# Patient Record
Sex: Male | Born: 1935 | Race: White | Hispanic: No | Smoking: Former smoker
Health system: Southern US, Community
[De-identification: ages and names within clinical notes are randomized; demographics above are authoritative.]

## PROBLEM LIST (undated history)

## (undated) DIAGNOSIS — D649 Anemia, unspecified: Secondary | ICD-10-CM

## (undated) DIAGNOSIS — I499 Cardiac arrhythmia, unspecified: Secondary | ICD-10-CM

## (undated) DIAGNOSIS — I780 Hereditary hemorrhagic telangiectasia: Secondary | ICD-10-CM

## (undated) DIAGNOSIS — I10 Essential (primary) hypertension: Secondary | ICD-10-CM

## (undated) DIAGNOSIS — C61 Malignant neoplasm of prostate: Secondary | ICD-10-CM

## (undated) DIAGNOSIS — M1990 Unspecified osteoarthritis, unspecified site: Secondary | ICD-10-CM

## (undated) DIAGNOSIS — E119 Type 2 diabetes mellitus without complications: Secondary | ICD-10-CM

## (undated) HISTORY — PX: CATARACT EXTRACTION W/ INTRAOCULAR LENS  IMPLANT, BILATERAL: SHX1307

## (undated) HISTORY — DX: Malignant neoplasm of prostate: C61

## (undated) HISTORY — PX: COLONOSCOPY: SHX174

## (undated) HISTORY — DX: Essential (primary) hypertension: I10

## (undated) HISTORY — DX: Type 2 diabetes mellitus without complications: E11.9

---

## 2000-02-10 ENCOUNTER — Encounter: Admission: RE | Admit: 2000-02-10 | Discharge: 2000-02-23 | Payer: Self-pay | Admitting: Family Medicine

## 2002-08-08 ENCOUNTER — Encounter: Admission: RE | Admit: 2002-08-08 | Discharge: 2002-08-08 | Payer: Self-pay | Admitting: Urology

## 2002-08-08 ENCOUNTER — Encounter: Payer: Self-pay | Admitting: Urology

## 2003-11-14 ENCOUNTER — Encounter: Payer: Self-pay | Admitting: Gastroenterology

## 2003-11-14 ENCOUNTER — Encounter (INDEPENDENT_AMBULATORY_CARE_PROVIDER_SITE_OTHER): Payer: Self-pay | Admitting: Specialist

## 2003-11-14 ENCOUNTER — Ambulatory Visit (HOSPITAL_COMMUNITY): Admission: RE | Admit: 2003-11-14 | Discharge: 2003-11-14 | Payer: Self-pay | Admitting: Gastroenterology

## 2003-11-14 DIAGNOSIS — K5521 Angiodysplasia of colon with hemorrhage: Secondary | ICD-10-CM | POA: Insufficient documentation

## 2003-11-14 DIAGNOSIS — D126 Benign neoplasm of colon, unspecified: Secondary | ICD-10-CM | POA: Insufficient documentation

## 2003-11-14 DIAGNOSIS — K5730 Diverticulosis of large intestine without perforation or abscess without bleeding: Secondary | ICD-10-CM | POA: Insufficient documentation

## 2006-12-20 ENCOUNTER — Ambulatory Visit: Payer: Self-pay | Admitting: Gastroenterology

## 2006-12-20 LAB — CONVERTED CEMR LAB
Basophils Relative: 0.1 % (ref 0.0–1.0)
Eosinophils Absolute: 0.2 10*3/uL (ref 0.0–0.6)
Eosinophils Relative: 2.3 % (ref 0.0–5.0)
HCT: 37.7 % — ABNORMAL LOW (ref 39.0–52.0)
Lymphocytes Relative: 15.2 % (ref 12.0–46.0)
Neutro Abs: 6.3 10*3/uL (ref 1.4–7.7)
Neutrophils Relative %: 72.7 % (ref 43.0–77.0)
Platelets: 289 10*3/uL (ref 150–400)
RDW: 15.8 % — ABNORMAL HIGH (ref 11.5–14.6)
Transferrin: 277.1 mg/dL (ref >=212.0)

## 2006-12-29 ENCOUNTER — Ambulatory Visit (HOSPITAL_COMMUNITY): Admission: RE | Admit: 2006-12-29 | Discharge: 2006-12-29 | Payer: Self-pay | Admitting: Urology

## 2008-05-09 ENCOUNTER — Ambulatory Visit (HOSPITAL_COMMUNITY): Admission: RE | Admit: 2008-05-09 | Discharge: 2008-05-09 | Payer: Self-pay | Admitting: Urology

## 2009-02-27 ENCOUNTER — Ambulatory Visit (HOSPITAL_COMMUNITY): Admission: RE | Admit: 2009-02-27 | Discharge: 2009-02-27 | Payer: Self-pay | Admitting: Urology

## 2009-07-27 ENCOUNTER — Emergency Department (HOSPITAL_COMMUNITY): Admission: EM | Admit: 2009-07-27 | Discharge: 2009-07-27 | Payer: Self-pay | Admitting: Emergency Medicine

## 2009-08-20 ENCOUNTER — Ambulatory Visit (HOSPITAL_COMMUNITY): Admission: RE | Admit: 2009-08-20 | Discharge: 2009-08-20 | Payer: Self-pay | Admitting: Urology

## 2010-02-28 ENCOUNTER — Encounter (HOSPITAL_COMMUNITY): Admission: RE | Admit: 2010-02-28 | Discharge: 2010-04-17 | Payer: Self-pay | Admitting: Urology

## 2010-08-10 ENCOUNTER — Encounter: Payer: BLUE CROSS/BLUE SHIELD | Admitting: Urology

## 2010-10-05 LAB — BASIC METABOLIC PANEL
BUN: 21 mg/dL (ref 6–23)
Calcium: 9.1 mg/dL (ref 8.4–10.5)
GFR calc non Af Amer: >60 mL/min (ref >60)
Glucose, Bld: 187 mg/dL — ABNORMAL HIGH (ref 70–99)

## 2010-10-05 LAB — CBC
HCT: 36.1 % — ABNORMAL LOW (ref 39.0–52.0)
Hemoglobin: 11.8 g/dL — ABNORMAL LOW (ref 13.0–17.0)
MCHC: 32.7 g/dL (ref 30.0–36.0)
RBC: 4 MIL/uL — ABNORMAL LOW (ref 4.22–5.81)
RDW: 14.8 % (ref 11.5–15.5)
WBC: 13.3 10*3/uL — ABNORMAL HIGH (ref 4.0–10.5)

## 2010-10-05 LAB — DIFFERENTIAL
Basophils Absolute: 0 10*3/uL (ref 0.0–0.1)
Lymphocytes Relative: 8 % — ABNORMAL LOW (ref 12–46)
Monocytes Relative: 10 % (ref 3–12)
Neutro Abs: 11 10*3/uL — ABNORMAL HIGH (ref 1.7–7.7)
Neutrophils Relative %: 82 % — ABNORMAL HIGH (ref 43–77)

## 2010-10-05 LAB — CK TOTAL AND CKMB (NOT AT ARMC)
CK, MB: 8.1 ng/mL (ref 0.3–4.0)
Total CK: 370 U/L — ABNORMAL HIGH (ref 7–232)

## 2010-12-02 NOTE — Assessment & Plan Note (Signed)
[facility]                         GASTROENTEROLOGY OFFICE NOTE   NAME:Witherington, Shyloh                           MRN:          [id number]  DATE:12/20/2006                            DOB:          Jan 06, 1916    HISTORY:  Mr. 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  He has had some  small colon polyps removed and does have diverticulosis.  Last  colonoscopy was in April of 2005.   He currently is without any gastrointestinal complaints.  He is having  regular bowel movements without melena or hematochezia.  He has no  anorexia, weight loss, upper gastrointestinal or hepatobiliary  complaints.  He is followed by Dr. Adrian Prince and is on Ferrex 150 mg  twice a day, Ramipril 2.5 mg a day and daily Centrum Silver.  He  otherwise is doing well and denies any problems on his review of systems  exam.   PHYSICAL EXAMINATION:  GENERAL APPEARANCE:  He is a healthy-appearing  white male appearing much younger than his stated age.  VITAL SIGNS:  He is 5 feet 11 inches and weighs 182 pounds.  Blood  pressure 134/70 and pulse was 80 and regular.  ABDOMEN:  His abdominal exam was entirely benign without  hepatosplenomegaly, abdominal masses or tenderness.  Bowel sounds were  normal.  Rectal exam was deferred.   ASSESSMENT:  Mr. Fraizer has had chronic gastrointestinal blood loss now  for the last 20 years with several negative colonoscopy exam's.  I see  no need to repeat his exam at this time unless he cannot keep up with  his iron loss with p.o. iron replacement therapy.   RECOMMENDATIONS:  I have sent Ed by the laboratory to check a CBC and  iron studies.  He is to continue his  medications as listed above with  p.r.n. gastrointestinal followup as needed.     Vania Rea. Jarold Motto, MD, Caleen Essex, FAGA  Electronically Signed    DRP/MedQ  DD: 12/20/2006  DT: 12/20/2006  Job #: [id number]   cc:   Tera Mater. Evlyn Kanner, M.D.

## 2011-02-04 ENCOUNTER — Other Ambulatory Visit: Payer: Self-pay | Admitting: Urology

## 2011-02-04 DIAGNOSIS — C61 Malignant neoplasm of prostate: Secondary | ICD-10-CM

## 2011-03-11 ENCOUNTER — Encounter (HOSPITAL_COMMUNITY)
Admission: RE | Admit: 2011-03-11 | Discharge: 2011-03-11 | Disposition: A | Discharge status: Home / Self Care | Payer: Medicare Other | Source: Ambulatory Visit | Attending: Urology | Admitting: Urology

## 2011-03-11 ENCOUNTER — Ambulatory Visit (HOSPITAL_COMMUNITY)
Admission: RE | Admit: 2011-03-11 | Discharge: 2011-03-11 | Disposition: A | Discharge status: Home / Self Care | Payer: Medicare Other | Source: Ambulatory Visit | Attending: Urology | Admitting: Urology

## 2011-03-11 DIAGNOSIS — C61 Malignant neoplasm of prostate: Secondary | ICD-10-CM

## 2011-03-11 MED ORDER — TECHNETIUM TC 99M MEDRONATE IV KIT
25.0000 | PACK | Freq: Once | INTRAVENOUS | Status: AC | PRN
  Administered 2011-03-11: 25 via INTRAVENOUS

## 2011-09-03 ENCOUNTER — Other Ambulatory Visit: Payer: Self-pay | Admitting: Urology

## 2011-09-03 DIAGNOSIS — C61 Malignant neoplasm of prostate: Secondary | ICD-10-CM

## 2011-09-09 ENCOUNTER — Encounter (HOSPITAL_COMMUNITY)
Admission: RE | Admit: 2011-09-09 | Discharge: 2011-09-09 | Disposition: A | Discharge status: Home / Self Care | Payer: Medicare Other | Source: Ambulatory Visit | Attending: Urology | Admitting: Urology

## 2011-09-09 DIAGNOSIS — C61 Malignant neoplasm of prostate: Secondary | ICD-10-CM | POA: Insufficient documentation

## 2011-09-09 MED ORDER — TECHNETIUM TC 99M MEDRONATE IV KIT
25.0000 | PACK | Freq: Once | INTRAVENOUS | Status: AC | PRN
  Administered 2011-09-09: 25 via INTRAVENOUS

## 2012-04-06 ENCOUNTER — Other Ambulatory Visit: Payer: Self-pay | Admitting: Urology

## 2012-04-06 DIAGNOSIS — C61 Malignant neoplasm of prostate: Secondary | ICD-10-CM

## 2012-04-14 ENCOUNTER — Ambulatory Visit (HOSPITAL_COMMUNITY)
Admission: RE | Admit: 2012-04-14 | Discharge: 2012-04-14 | Disposition: A | Discharge status: Home / Self Care | Payer: Medicare Other | Source: Ambulatory Visit | Attending: Urology | Admitting: Urology

## 2012-04-14 ENCOUNTER — Encounter (HOSPITAL_COMMUNITY)
Admission: RE | Admit: 2012-04-14 | Discharge: 2012-04-14 | Disposition: A | Discharge status: Home / Self Care | Payer: Medicare Other | Source: Ambulatory Visit | Attending: Urology | Admitting: Urology

## 2012-04-14 DIAGNOSIS — C61 Malignant neoplasm of prostate: Secondary | ICD-10-CM | POA: Insufficient documentation

## 2012-04-14 DIAGNOSIS — R972 Elevated prostate specific antigen [PSA]: Secondary | ICD-10-CM | POA: Insufficient documentation

## 2012-04-14 MED ORDER — TECHNETIUM TC 99M MEDRONATE IV KIT
24.0000 | PACK | Freq: Once | INTRAVENOUS | Status: AC | PRN
  Administered 2012-04-14: 24 via INTRAVENOUS

## 2012-06-15 ENCOUNTER — Other Ambulatory Visit: Payer: Self-pay | Admitting: Urology

## 2012-06-15 ENCOUNTER — Ambulatory Visit (HOSPITAL_COMMUNITY)
Admission: RE | Admit: 2012-06-15 | Discharge: 2012-06-15 | Disposition: A | Discharge status: Home / Self Care | Payer: Medicare Other | Source: Ambulatory Visit | Attending: Urology | Admitting: Urology

## 2012-06-15 DIAGNOSIS — J984 Other disorders of lung: Secondary | ICD-10-CM

## 2012-06-15 DIAGNOSIS — Z8546 Personal history of malignant neoplasm of prostate: Secondary | ICD-10-CM | POA: Insufficient documentation

## 2013-05-01 ENCOUNTER — Other Ambulatory Visit (HOSPITAL_COMMUNITY): Payer: Self-pay | Admitting: Endocrinology

## 2013-05-01 DIAGNOSIS — J90 Pleural effusion, not elsewhere classified: Secondary | ICD-10-CM

## 2013-05-02 ENCOUNTER — Ambulatory Visit (HOSPITAL_COMMUNITY)
Admission: RE | Admit: 2013-05-02 | Discharge: 2013-05-02 | Disposition: A | Discharge status: Home / Self Care | Payer: Medicare Other | Source: Ambulatory Visit | Attending: Endocrinology | Admitting: Endocrinology

## 2013-05-02 ENCOUNTER — Ambulatory Visit (HOSPITAL_COMMUNITY)
Admission: RE | Admit: 2013-05-02 | Discharge: 2013-05-02 | Disposition: A | Discharge status: Home / Self Care | Payer: Medicare Other | Source: Ambulatory Visit | Attending: Radiology | Admitting: Radiology

## 2013-05-02 DIAGNOSIS — J90 Pleural effusion, not elsewhere classified: Secondary | ICD-10-CM

## 2013-05-02 DIAGNOSIS — R0602 Shortness of breath: Secondary | ICD-10-CM | POA: Insufficient documentation

## 2013-05-02 LAB — BODY FLUID CELL COUNT WITH DIFFERENTIAL
Eos, Fluid: 2 %
Monocyte-Macrophage-Serous Fluid: 35 % — ABNORMAL LOW (ref 50–90)
Neutrophil Count, Fluid: 10 % (ref 0–25)
Total Nucleated Cell Count, Fluid: 280 cu mm (ref 0–1000)

## 2013-05-02 LAB — GLUCOSE, SEROUS FLUID: Glucose, Fluid: 208 mg/dL

## 2013-05-02 LAB — PROTEIN, BODY FLUID: Total protein, fluid: 2.1 g/dL

## 2013-05-02 LAB — LACTATE DEHYDROGENASE, PLEURAL OR PERITONEAL FLUID

## 2013-05-02 NOTE — Procedures (Signed)
Successful US guided left thoracentesis. Yielded 1.4L of clear yellow fluid. Pt tolerated procedure well. No immediate complications.  Specimen was sent for labs. CXR ordered.  Brayton El PA-C 05/02/2013 11:40 AM

## 2013-05-05 LAB — BODY FLUID CULTURE: Culture: NO GROWTH

## 2013-05-09 ENCOUNTER — Ambulatory Visit
Admission: RE | Admit: 2013-05-09 | Discharge: 2013-05-09 | Disposition: A | Discharge status: Home / Self Care | Payer: Medicare Other | Source: Ambulatory Visit | Attending: Endocrinology | Admitting: Endocrinology

## 2013-05-09 ENCOUNTER — Other Ambulatory Visit: Payer: Self-pay | Admitting: Endocrinology

## 2013-05-09 ENCOUNTER — Telehealth: Payer: Self-pay

## 2013-05-09 DIAGNOSIS — J90 Pleural effusion, not elsewhere classified: Secondary | ICD-10-CM

## 2013-05-09 MED ORDER — IOHEXOL 300 MG/ML  SOLN
75.0000 mL | Freq: Once | INTRAMUSCULAR | Status: AC | PRN
  Administered 2013-05-09: 75 mL via INTRAVENOUS

## 2013-05-09 NOTE — Telephone Encounter (Signed)
[email or web address] x1 on named VM for [employer]

## 2013-05-10 NOTE — Telephone Encounter (Signed)
Spoke with Malachi Bonds and given appt time with Dr Maple Hudson 05/11/13 at 9:45

## 2013-05-11 ENCOUNTER — Encounter: Payer: Self-pay | Admitting: Internal Medicine

## 2013-05-11 ENCOUNTER — Ambulatory Visit (INDEPENDENT_AMBULATORY_CARE_PROVIDER_SITE_OTHER): Payer: Medicare Other | Admitting: Internal Medicine

## 2013-05-11 VITALS — BP 108/60 | HR 83 | Ht 70.0 in | Wt 159.0 lb

## 2013-05-11 DIAGNOSIS — C61 Malignant neoplasm of prostate: Secondary | ICD-10-CM

## 2013-05-11 DIAGNOSIS — J90 Pleural effusion, not elsewhere classified: Secondary | ICD-10-CM

## 2013-05-11 DIAGNOSIS — Z8546 Personal history of malignant neoplasm of prostate: Secondary | ICD-10-CM

## 2013-05-11 NOTE — Progress Notes (Addendum)
05/11/13-  79 yo M referred courtesy of Jason Lawson- PE; fluid coming back in lungs Daughter Jason Lawson is here Patient smoked one half pack per day for 30 years, quitting 50 years ago.Denies any prior diagnosis of lung disease, including pneumonia or TB. Long history of prostate cancer, treated with radiation therapy 25 years ago and currently on Lupron injection. Gradually developed dyspnea on exertion and dry cough over the past month. Discovered moderate left pleural effusion with thoracentesis on 05/02/2013 for1.4 L of clear yellow fluid. Fluid protein 2.1g, culture negative, fluid LDH 184, fluid glucose 208. Fluid cytology showed reactive mesothelial cells. He felt significantly better after thoracentesis with improved dyspnea and relief of cough. Cough has not returned. Dyspnea remains better than before but not baseline. He was walking every other day for one half hour until months ago and has not resumed. Denies chest pain. CT chest shows apparent reaccumulation of fluid. Several small right upper lobe nodules and ground-glass opacity in the right upper lobe report by radiology to be infectious or inflammatory. CT 05/09/13- IMPRESSION:  1. Moderate mobile left pleural effusion with associated mild  atelectasis.  2. Branching nodules in the right upper lobe and ground-glass  opacity in the right upper lobe are likely infectious or  inflammatory.  3. A 5 mm nodule at the right lung base. Given risk factors for  bronchogenic carcinoma, follow-up chest CT at 6 - 12 months is  recommended. This recommendation follows the consensus statement:  Guidelines for Management of Small Pulmonary Nodules Detected on CT  Scans: A Statement from the Fleischner Society as published in  Radiology 2005;237:395-400.  Electronically Signed  By: Jason Lawson M.D.  On: 05/09/2013 16:44  Prior to Admission medications   Medication Sig Start Date End Date Taking? Authorizing Provider  b complex  vitamins tablet Take 1 tablet by mouth daily.   Yes Historical Provider, MD  Calcium Carbonate (CALTRATE 600 PO) Take 1 capsule by mouth 2 (two) times daily.   Yes Historical Provider, MD  furosemide (LASIX) 40 MG tablet Take 1 tablet by mouth daily. 05/01/13  Yes Historical Provider, MD  iron polysaccharides (NIFEREX) 150 MG capsule Take 150 mg by mouth 2 (two) times daily.   Yes Historical Provider, MD  metFORMIN (GLUCOPHAGE) 500 MG tablet Take 1 tablet by mouth 2 (two) times daily. 04/03/13  Yes Historical Provider, MD  Multiple Vitamins-Minerals (CENTRUM SILVER PO) Take 1 tablet by mouth daily.   Yes Historical Provider, MD  ramipril (ALTACE) 2.5 MG capsule Take 1 capsule by mouth daily. 05/07/13  Yes Historical Provider, MD   Past Medical History  Diagnosis Date  . Diabetes   . Prostate cancer    History reviewed. No pertinent past surgical history. Family History  Problem Relation Age of Onset  . Allergies Daughter   . Heart disease Daughter   . Breast cancer Mother    History   Social History  . Marital Status: Widowed    Spouse Name: N/A    Number of Children: 1  . Years of Education: N/A   Occupational History  . retired-[employer]    Social History Main Topics  . Smoking status: Former Smoker -- 0.50 packs/day for 30 years    Types: Cigarettes    Quit date: 05/12/1963  . Smokeless tobacco: Not on file  . Alcohol Use: Yes     Comment: very rare use  . Drug Use: No  . Sexual Activity: Not on file  Other Topics Concern  . Not on file   Social History Narrative  . No narrative on file   ROS-see HPI Constitutional:   No-   weight loss, night sweats, fevers, chills, fatigue, lassitude. HEENT:   No-  headaches, difficulty swallowing, tooth/dental problems, sore throat,       No-  sneezing, itching, ear ache, nasal congestion, post nasal drip,  CV:  No-   chest pain, orthopnea, PND, swelling in lower extremities, anasarca, dizziness,  palpitations Resp: +shortness of breath with exertion or at rest.              No-   productive cough,  No non-productive cough,  No- coughing up of blood.              No-   change in color of mucus.  No- wheezing.   Skin: No-   rash or lesions. GI:  No-   heartburn, indigestion, abdominal pain, nausea, vomiting, diarrhea,                 change in bowel habits, loss of appetite GU: No-   dysuria, change in color of urine, no urgency or frequency.  No- flank pain. MS:  No-   joint pain or swelling.  No- decreased range of motion.  No- back pain. Neuro-     nothing unusual Psych:  No- change in mood or affect. No depression or anxiety.  No memory loss.  OBJ- Physical Exam General- Alert, Oriented, Affect-appropriate, Distress- none acute. Trim, alert man looking younger than stated age and appropriately interactive. Skin- rash-none, lesions- none, excoriation- none Lymphadenopathy- none Head- atraumatic            Eyes- Gross vision intact, PERRLA, conjunctivae and secretions clear            Ears- Hearing, canals-normal            Nose- Clear, no-Septal dev, mucus, polyps, erosion, perforation             Throat- Mallampati II , mucosa clear , drainage- none, tonsils- atrophic Neck- flexible , trachea midline, no stridor , thyroid nl, carotid no bruit Chest - symmetrical excursion , unlabored           Heart/CV- RRR , no murmur , no gallop  , no rub, nl s1 s2                           - JVD- none , edema- none, stasis changes- none, varices- none           Lung- + Dullness to percussion and absence of breath sounds lower half of left chest, rub- none           Chest wall-  Abd- tender-no, distended-no, bowel sounds-present, HSM- no Br/ Gen/ Rectal- Not done, not indicated Extrem- cyanosis- none, clubbing, none, atrophy- none, strength- nl Neuro- grossly intact to observation

## 2013-05-11 NOTE — Assessment & Plan Note (Signed)
Most likely this effusion is related to his prostate cancer. Exudative by chemistry and apparently reaccumulating after thoracentesis on October 14, I think he will be better served by surgical evaluation for drainage options rather than repeated thoracentesis. Less likely his effusion is related to his prostate cancer, although the branching nodules in the right upper lobe are favored to be infectious or inflammatory. Fluid cultures so far are negative and he is not running fever or experienced obvious symptoms of infection. Although 79 years old, he looks appropriate for consideration of more than passive observation, and capable of participating in the decision.

## 2013-05-11 NOTE — Patient Instructions (Signed)
Order- referral to Thoracic Surgery for dx recurrent exudative pleural effusion, hx prostate CA

## 2013-05-12 ENCOUNTER — Other Ambulatory Visit: Payer: Self-pay | Admitting: *Deleted

## 2013-05-12 ENCOUNTER — Institutional Professional Consult (permissible substitution) (INDEPENDENT_AMBULATORY_CARE_PROVIDER_SITE_OTHER): Payer: Medicare Other | Admitting: Cardiothoracic Surgery

## 2013-05-12 VITALS — BP 116/72 | HR 74 | Resp 18 | Ht 67.0 in | Wt 167.0 lb

## 2013-05-12 DIAGNOSIS — J90 Pleural effusion, not elsewhere classified: Secondary | ICD-10-CM

## 2013-05-12 NOTE — Progress Notes (Signed)
PCP is Julian Hy, MD Referring Provider is Waymon Budge, MD  Chief Complaint  Patient presents with  . Pleural Effusion    left...has had thoracentesis x 1.Marland KitchenMarland KitchenCT CHEST 05/09/13   recurrent left pleural effusion status post thoracentesis  HPI: 79 year old Caucasian male nonsmoker presents for evaluation of recurrent left pleural effusion. Early this month he had shortness of breath and cough and was found have a moderate left pleural effusion. He underwent ultrasound-guided left thoracentesis removing 1.2 L of an exudative fluid. Cultures were negative. Cytology was negative. Patient has history of atrial fibrillation hypertension and probably has diastolic heart failure. He was started on oral Lasix recently. He denies any other symptoms of heart failure however including needle edema or orthopnea. He has been having decreased exercise tolerance associated with the left pleural effusion. He presents for surgical options including left Pleurx catheter. He has never had previous thoracic surgery. He denies history of MI. She's not on anticoagulation for his atrial fibrillation because of history of GI bleeding. He lives with his daughter Darel Hong.  Past Medical History  Diagnosis Date  . Diabetes   . Prostate cancer     No past surgical history on file.  Family History  Problem Relation Age of Onset  . Allergies Daughter   . Heart disease Daughter   . Breast cancer Mother     Social History History  Substance Use Topics  . Smoking status: Former Smoker -- 0.50 packs/day for 30 years    Types: Cigarettes    Quit date: 05/12/1963  . Smokeless tobacco: Not on file  . Alcohol Use: Yes     Comment: very rare use    Current Outpatient Prescriptions  Medication Sig Dispense Refill  . b complex vitamins tablet Take 1 tablet by mouth daily.      . Calcium Carbonate (CALTRATE 600 PO) Take 1 capsule by mouth 2 (two) times daily.      . furosemide (LASIX) 40 MG tablet Take 1  tablet by mouth daily.      . iron polysaccharides (NIFEREX) 150 MG capsule Take 150 mg by mouth 2 (two) times daily.      Marland Kitchen leuprolide (LUPRON) 11.25 MG injection Inject 11.25 mg into the muscle every 3 (three) months. AS DIRECTED      . metFORMIN (GLUCOPHAGE) 500 MG tablet Take 1 tablet by mouth 2 (two) times daily.      . Multiple Vitamins-Minerals (CENTRUM SILVER PO) Take 1 tablet by mouth daily.      . ramipril (ALTACE) 2.5 MG capsule Take 1 capsule by mouth daily.       No current facility-administered medications for this visit.    No Known Allergies  Review of Systems Remote history of prostate cancer treated with external beam radiation recurrent now being treated with hormonal therapy. He states he has no problems passing his urine however. No recent weight loss or fever. No recent upper respiratory infection. Patient is right-hand dominant. He denies stroke or neuropathy. No history of bleeding problems other than GI bleeding.  BP 116/72  Pulse 74  Resp 18  Ht 5\' 7"  (1.702 m)  Wt 167 lb (75.751 kg)  BMI 26.15 kg/m2  SpO2 98% Physical Exam Alert and comfortable accompanied by his daughter HEENT normocephalic good dentition Neck without JVD mass or adenopathy Breath sounds are diminished at the left base clear on the right Cardiac rhythm is regular but without S3 gallop or murmur Abdomen soft nontender Extremities without tenderness edema or  cyanosis Neurologic is nonfocal  Diagnostic Tests: Chest x-ray, CT scan demonstrates recurrent pleural effusion, nonloculated. No evidence of adenopathy or suggestion of malignancy. No evidence of airspace disease or pneumonia.  Impression: Left Pleurx catheter placement was recommended to the patient and his daughter. I discussed the procedure and postoperative care in detail. They wish to proceed with a Pleurx catheter to manage recurrent pleural effusion, to avoid future repeated thoracentesis, and to provide a chance for  resolution of the effusion and subsequent removal of the catheter.  Plan: Left Pleurx catheter scheduled for October 29 at . Patient will be kept overnight for observation due to his advanced age history of atrial fibrillation and diabetes.

## 2013-05-12 NOTE — Patient Instructions (Signed)
No breakfast on day of surgery Take blood pressure pill-ramapril with sip of water

## 2013-05-15 ENCOUNTER — Encounter (HOSPITAL_COMMUNITY): Payer: Self-pay | Admitting: Pharmacy Technician

## 2013-05-16 ENCOUNTER — Encounter (HOSPITAL_COMMUNITY): Payer: Self-pay

## 2013-05-16 ENCOUNTER — Other Ambulatory Visit: Payer: Self-pay | Admitting: *Deleted

## 2013-05-16 ENCOUNTER — Encounter (HOSPITAL_COMMUNITY)
Admission: RE | Admit: 2013-05-16 | Discharge: 2013-05-16 | Disposition: A | Discharge status: Home / Self Care | Payer: Medicare Other | Source: Ambulatory Visit | Attending: Cardiothoracic Surgery | Admitting: Cardiothoracic Surgery

## 2013-05-16 ENCOUNTER — Encounter (HOSPITAL_COMMUNITY): Payer: Self-pay | Admitting: Vascular Surgery

## 2013-05-16 VITALS — BP 106/68 | HR 85 | Temp 97.5°F | Resp 18 | Ht 71.0 in | Wt 158.4 lb

## 2013-05-16 DIAGNOSIS — Z01818 Encounter for other preprocedural examination: Secondary | ICD-10-CM | POA: Insufficient documentation

## 2013-05-16 DIAGNOSIS — J90 Pleural effusion, not elsewhere classified: Secondary | ICD-10-CM

## 2013-05-16 DIAGNOSIS — Z01812 Encounter for preprocedural laboratory examination: Secondary | ICD-10-CM | POA: Insufficient documentation

## 2013-05-16 DIAGNOSIS — Z01810 Encounter for preprocedural cardiovascular examination: Secondary | ICD-10-CM | POA: Insufficient documentation

## 2013-05-16 HISTORY — DX: Cardiac arrhythmia, unspecified: I49.9

## 2013-05-16 HISTORY — DX: Anemia, unspecified: D64.9

## 2013-05-16 HISTORY — DX: Unspecified osteoarthritis, unspecified site: M19.90

## 2013-05-16 HISTORY — DX: Hereditary hemorrhagic telangiectasia: I78.0

## 2013-05-16 LAB — COMPREHENSIVE METABOLIC PANEL
ALT: 12 U/L (ref 0–53)
AST: 23 U/L (ref 0–37)
Albumin: 3.4 g/dL — ABNORMAL LOW (ref 3.5–5.2)
Alkaline Phosphatase: 65 U/L (ref 39–117)
BUN: 21 mg/dL (ref 6–23)
CO2: 30 mEq/L (ref 19–32)
Calcium: 9.5 mg/dL (ref 8.4–10.5)
Chloride: 96 mEq/L (ref 96–112)
Creatinine, Ser: 0.92 mg/dL (ref 0.50–1.35)
GFR calc Af Amer: 81 mL/min — ABNORMAL LOW (ref >90)
GFR calc non Af Amer: 69 mL/min — ABNORMAL LOW (ref >90)
Glucose, Bld: 187 mg/dL — ABNORMAL HIGH (ref 70–99)
Potassium: 4.3 mEq/L (ref 3.5–5.1)
Sodium: 135 mEq/L (ref 135–145)
Total Bilirubin: 0.3 mg/dL (ref 0.3–1.2)
Total Protein: 7.5 g/dL (ref 6.0–8.3)

## 2013-05-16 LAB — CBC
HCT: 37.6 % — ABNORMAL LOW (ref 39.0–52.0)
Hemoglobin: 12 g/dL — ABNORMAL LOW (ref 13.0–17.0)
MCH: 27 pg (ref 26.0–34.0)
MCHC: 31.9 g/dL (ref 30.0–36.0)
MCV: 84.7 fL (ref 78.0–100.0)
Platelets: 353 10*3/uL (ref 150–400)
RBC: 4.44 MIL/uL (ref 4.22–5.81)
RDW: 16.3 % — ABNORMAL HIGH (ref 11.5–15.5)
WBC: 11 10*3/uL — ABNORMAL HIGH (ref 4.0–10.5)

## 2013-05-16 LAB — APTT: aPTT: 33 seconds (ref 24–37)

## 2013-05-16 LAB — PROTIME-INR
INR: 1.1 (ref 0.00–1.49)
Prothrombin Time: 14 seconds (ref 11.6–15.2)

## 2013-05-16 MED ORDER — DEXTROSE 5 % IV SOLN
1.5000 g | INTRAVENOUS | Status: DC
  Filled 2013-05-16: qty 1.5

## 2013-05-16 NOTE — Pre-Procedure Instructions (Signed)
Remus Hagedorn  05/16/2013   Your procedure is scheduled on:  Wednesday, May 17, 2013  Report to [facility] Short Stay (use Main Entrance "A'') at 6:30 AM.  Call this number if you have problems the morning of surgery: [phone]   Remember:   Do not eat food or drink liquids after midnight.   Take these medicines the morning of surgery with A SIP OF WATER: None  Stop taking Aspirin, vitamins, and herbal medications. Do not take any NSAIDs ie: Ibuprofen,  Advil,  Naproxen or any medication containing Aspirin.  Do not wear jewelry, make-up or nail polish.  Do not wear lotions, powders, or perfumes. You may NOT wear deodorant.  Do not shave 48 hours prior to surgery. Men may shave face and neck.  Do not bring valuables to the hospital.  [facility] is not responsible for any belongings or valuables.               Contacts, dentures or bridgework may not be worn into surgery.  Leave suitcase in the car. After surgery it may be brought to your room.  For patients admitted to the hospital, discharge time is determined by your treatment team.               Patients discharged the day of surgery will not be allowed to drive home.  Name and phone number of your driver:   Special Instructions: Shower using CHG 2 nights before surgery and the night before surgery.  If you shower the day of surgery use CHG.  Use special wash - you have one bottle of CHG for all showers.  You should use approximately 1/3 of the bottle for each shower.   Please read over the following fact sheets that you were given: Pain Booklet, Coughing and Deep Breathing and Surgical Site Infection Prevention

## 2013-05-16 NOTE — Progress Notes (Signed)
Anesthesia:  Patient is a 79 year old male scheduled for insertion of a left Pleurex catheter on 05/17/13 by Dr. Donata Clay.  Anesthesia type is posted as local.  According to TCTS notes, a overnight stay is planned.    He was recently found to have a moderate left pleural effusion that required thoracentesis 05/02/13. Cytology showed reactive mesothelial cells.  Unfortunately his left pleural effusion recurred, and insertion of a Pleurex catheter has now been recommended.  Other history includes former smoker, prostate cancer s/p radiation therapy > 20 years ago, DM2, anemia, arthritis, cataract extraction.  Dr. Zenaida Niece Trigt's notes mention a history of afib (not being treated with anticogulation therapy due to history of GI bleed); however, patient denies known history of afib, but does state that Dr. Evlyn Kanner told him there was some irregularities in his heart rhythm that he did not think would need further evaluation.  PCP is Dr. Adrian Prince. Pulmonologist is Dr. Jetty Duhamel. He is here today with his daughter Britt Boozer.  Meds: B complex, Caltrate, Lasix, Niferex, metformin, MVI, Altace. Vitals: 106/68, HR 85 bpm, R 18, O2 sat 99%.  EKG on 05/16/13 showed SR with PACs, LAD, inferior infarct (age undetermined), cannot rule out anterior infarct (age undetermined).  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  He reports a remote history of a stress test, but nothing recent.  CXR on 05/16/13 showed: 1. Cardiomegaly.  2. Bronchitic changes.  3. Left pleural effusion.   CT 05/09/13- IMPRESSION:  1. Moderate mobile left pleural effusion with associated mild atelectasis.  2. Branching nodules in the right upper lobe and ground-glass opacity in the right upper lobe are likely infectious or inflammatory.  3. A 5 mm nodule at the right lung base. Given risk factors for bronchogenic carcinoma, follow-up chest CT at 6 - 12 months is recommended. This recommendation follows the consensus statement:  Guidelines for Management of Small Pulmonary Nodules Detected on CT Scans: A Statement from the Fleischner Society as published in Radiology 2005;237:395-400.   Preoperative labs noted.    Patient is a pleasant Caucasian male in NAD.  He denies SOB at rest.  He does get DOE with activities such as going up stairs.  He is sleeping on four pillows at night since his recurrent effusion--previously two.  He denies chest pain, syncope, edema.  Hear shows an irregular rhythm.  I did not hear a definite murmur.  Lungs sounds were diminished at the left base. No LE edema noted.  I reviewed above with anesthesiologist Dr. Noreene Larsson who agrees that if patient remains asymptomatic from a CV standpoint then it is anticipated that he could proceed with this procedure. (Update: It appears that since his PAT visit, the procedure has been canceled for tomorrow.)  Velna Ochs [facility]/Anesthesiology Phone [phone] 05/16/2013 2:48 PM

## 2013-05-17 ENCOUNTER — Encounter (HOSPITAL_COMMUNITY): Admission: RE | Payer: Self-pay | Source: Ambulatory Visit

## 2013-05-17 ENCOUNTER — Ambulatory Visit (HOSPITAL_COMMUNITY): Admission: RE | Admit: 2013-05-17 | Payer: Medicare Other | Source: Ambulatory Visit | Admitting: Cardiothoracic Surgery

## 2013-05-17 SURGERY — INSERTION, PLEURAL DRAINAGE CATHETER
Anesthesia: LOCAL | Site: Chest | Laterality: Left

## 2013-05-18 ENCOUNTER — Other Ambulatory Visit: Payer: Self-pay | Admitting: *Deleted

## 2013-05-18 DIAGNOSIS — J90 Pleural effusion, not elsewhere classified: Secondary | ICD-10-CM

## 2013-05-24 ENCOUNTER — Ambulatory Visit (INDEPENDENT_AMBULATORY_CARE_PROVIDER_SITE_OTHER): Payer: Medicare Other | Admitting: Cardiothoracic Surgery

## 2013-05-24 ENCOUNTER — Ambulatory Visit
Admission: RE | Admit: 2013-05-24 | Discharge: 2013-05-24 | Disposition: A | Discharge status: Home / Self Care | Payer: Medicare Other | Source: Ambulatory Visit | Attending: Cardiothoracic Surgery | Admitting: Cardiothoracic Surgery

## 2013-05-24 ENCOUNTER — Encounter: Payer: Self-pay | Admitting: Cardiothoracic Surgery

## 2013-05-24 VITALS — BP 112/70 | HR 88 | Resp 20 | Ht 71.0 in | Wt 158.0 lb

## 2013-05-24 DIAGNOSIS — J90 Pleural effusion, not elsewhere classified: Secondary | ICD-10-CM

## 2013-05-24 NOTE — Progress Notes (Signed)
PCP is Julian Hy, MD Referring Provider is Waymon Budge, MD  Chief Complaint  Patient presents with  . Pleural Effusion    1 week f/u with CXR    HPI: Patient returns for followup of left pleural effusion. He was initially referred for possible Pleurx catheter. However after placing the patient on daily Lasix the pleural effusion has resolved and has not recurred. He feels better and has more energy. Chest x-ray today shows very minimal left pleural effusion on 40 mg daily Lasix   Past Medical History  Diagnosis Date  . Diabetes   . Prostate cancer   . Arthritis   . Anemia   . Dysrhythmia     "irregularities" in rhythm  . Osler hemorrhagic telangiectasia syndrome     Hx: of    Past Surgical History  Procedure Laterality Date  . Cataract extraction w/ intraocular lens  implant, bilateral      Hx: of  . Colonoscopy      HX: of    Family History  Problem Relation Age of Onset  . Allergies Daughter   . Heart disease Daughter   . Breast cancer Mother     Social History History  Substance Use Topics  . Smoking status: Former Smoker -- 0.50 packs/day for 30 years    Types: Cigarettes    Quit date: 05/12/1963  . Smokeless tobacco: Former Neurosurgeon    Types: Chew     Comment: "Quit befor using cigarettes"  . Alcohol Use: Yes     Comment: very rare use    Current Outpatient Prescriptions  Medication Sig Dispense Refill  . b complex vitamins tablet Take 1 tablet by mouth daily.      . Calcium Carbonate (CALTRATE 600 PO) Take 1 capsule by mouth 2 (two) times daily.      . furosemide (LASIX) 40 MG tablet Take 40 mg by mouth daily.       . iron polysaccharides (NIFEREX) 150 MG capsule Take 150 mg by mouth 2 (two) times daily.      . metFORMIN (GLUCOPHAGE) 500 MG tablet Take 500 mg by mouth daily with breakfast.       . Multiple Vitamins-Minerals (CENTRUM SILVER PO) Take 1 tablet by mouth daily.      . ramipril (ALTACE) 2.5 MG capsule Take 2.5 mg by mouth daily.         No current facility-administered medications for this visit.    Allergies  Allergen Reactions  . Aspirin Other (See Comments)    Causes GI bleeding    Review of Systems no cough fever  BP 112/70  Pulse 88  Resp 20  Ht 5\' 11"  (1.803 m)  Wt 158 lb (71.668 kg)  BMI 22.05 kg/m2  SpO2 98% Physical Exam Lungs clear bilaterally No edema  Diagnostic Tests: Chest x-ray with trace left pleural effusion  Impression: Symptoms improved on daily Lasix-probably related to diastolic heart dysfunction  Plan: Reduced to Lasix 20 mg a day and follow clinically. Return with chest x-ray in 4 weeks.

## 2013-06-12 ENCOUNTER — Telehealth: Payer: Self-pay | Admitting: *Deleted

## 2013-06-12 NOTE — Telephone Encounter (Signed)
Jason Lawson daughter called to relate that he began having increasing SOB two days ago and increased his Lasix to 40 mgm daily.  Dr. Donata Clay had decreased it to 20 mgm at his last visit due to decreasing left pleural effusion.  He is breathing much better now.  I offered him a visit on Wednesday with a chest xray, but he prefers to wait until his scheduled appointment on 06/28/13. He was advised to call sooner if needed.

## 2013-06-27 ENCOUNTER — Other Ambulatory Visit: Payer: Self-pay | Admitting: *Deleted

## 2013-06-27 DIAGNOSIS — J90 Pleural effusion, not elsewhere classified: Secondary | ICD-10-CM

## 2013-06-28 ENCOUNTER — Encounter: Payer: Self-pay | Admitting: Cardiothoracic Surgery

## 2013-06-28 ENCOUNTER — Ambulatory Visit (INDEPENDENT_AMBULATORY_CARE_PROVIDER_SITE_OTHER): Payer: Medicare Other | Admitting: Cardiothoracic Surgery

## 2013-06-28 ENCOUNTER — Ambulatory Visit
Admission: RE | Admit: 2013-06-28 | Discharge: 2013-06-28 | Disposition: A | Discharge status: Home / Self Care | Payer: Medicare Other | Source: Ambulatory Visit | Attending: Cardiothoracic Surgery | Admitting: Cardiothoracic Surgery

## 2013-06-28 VITALS — BP 112/65 | HR 84 | Resp 16 | Ht 71.0 in | Wt 158.0 lb

## 2013-06-28 DIAGNOSIS — J90 Pleural effusion, not elsewhere classified: Secondary | ICD-10-CM

## 2013-06-29 NOTE — Progress Notes (Signed)
PCP is Julian Hy, MD Referring Provider is Waymon Budge, MD  Chief Complaint  Patient presents with  . Pleural Effusion    left....follow up with chest xray    HPI: Patient returns for a minimally symptomatic mild left pleural effusion. It is been adequately controlled with oral Lasix 40 mg daily. Probably related to diastolic heart failure Chest x-ray today shows persistent mild pleural effusion slightly increased asymptomatic. Patient is 79 years old would be poor candidate for surgery. Conservative medical management is recommended but we'll continue to follow patient. His had a thoracentesis previously in October.   Past Medical History  Diagnosis Date  . Diabetes   . Prostate cancer   . Arthritis   . Anemia   . Dysrhythmia     "irregularities" in rhythm  . Osler hemorrhagic telangiectasia syndrome     Hx: of    Past Surgical History  Procedure Laterality Date  . Cataract extraction w/ intraocular lens  implant, bilateral      Hx: of  . Colonoscopy      HX: of    Family History  Problem Relation Age of Onset  . Allergies Daughter   . Heart disease Daughter   . Breast cancer Mother     Social History History  Substance Use Topics  . Smoking status: Former Smoker -- 0.50 packs/day for 30 years    Types: Cigarettes    Quit date: 05/12/1963  . Smokeless tobacco: Former Neurosurgeon    Types: Chew     Comment: "Quit befor using cigarettes"  . Alcohol Use: Yes     Comment: very rare use    Current Outpatient Prescriptions  Medication Sig Dispense Refill  . b complex vitamins tablet Take 1 tablet by mouth daily.      . Calcium Carbonate (CALTRATE 600 PO) Take 1 capsule by mouth 2 (two) times daily.      . furosemide (LASIX) 40 MG tablet Take 40 mg by mouth daily.       . iron polysaccharides (NIFEREX) 150 MG capsule Take 150 mg by mouth 2 (two) times daily.      . metFORMIN (GLUCOPHAGE) 500 MG tablet Take 500 mg by mouth 2 (two) times daily with a meal.        . Multiple Vitamins-Minerals (CENTRUM SILVER PO) Take 1 tablet by mouth daily.      . ramipril (ALTACE) 2.5 MG capsule Take 2.5 mg by mouth daily.        No current facility-administered medications for this visit.    Allergies  Allergen Reactions  . Aspirin Other (See Comments)    Causes GI bleeding    Review of Systems no significant of breath or cough  BP 112/65  Pulse 84  Resp 16  Ht 5\' 11"  (1.803 m)  Wt 158 lb (71.668 kg)  BMI 22.05 kg/m2  SpO2 98% Physical Exam Alert and comfortable Breath sounds slightly diminished on the left No edema  Diagnostic Tests: Chest x-ray reviewed mild left pleural effusion   Impression: Minimally symptomatic pleural effusion controlled well with oral Lasix-40 mg per day appears to be adequate.  Plan: Continue current medical therapy Return for followup chest x-ray in 6 weeks Restricting salt intake reviewed with patient and his daughter

## 2013-07-24 ENCOUNTER — Other Ambulatory Visit: Payer: Self-pay | Admitting: *Deleted

## 2013-07-24 DIAGNOSIS — J90 Pleural effusion, not elsewhere classified: Secondary | ICD-10-CM

## 2013-07-26 ENCOUNTER — Ambulatory Visit
Admission: RE | Admit: 2013-07-26 | Discharge: 2013-07-26 | Disposition: A | Discharge status: Home / Self Care | Payer: Medicare Other | Source: Ambulatory Visit | Attending: Cardiothoracic Surgery | Admitting: Cardiothoracic Surgery

## 2013-07-26 ENCOUNTER — Encounter: Payer: Self-pay | Admitting: Cardiothoracic Surgery

## 2013-07-26 ENCOUNTER — Ambulatory Visit (INDEPENDENT_AMBULATORY_CARE_PROVIDER_SITE_OTHER): Payer: Medicare Other | Admitting: Cardiothoracic Surgery

## 2013-07-26 VITALS — BP 120/66 | HR 86 | Resp 16 | Ht 71.0 in | Wt 158.0 lb

## 2013-07-26 DIAGNOSIS — J90 Pleural effusion, not elsewhere classified: Secondary | ICD-10-CM

## 2013-07-26 NOTE — Progress Notes (Signed)
PCP is Sheela Stack, MD Referring Provider is Deneise Lever, MD  Chief Complaint  Patient presents with  . Follow-up    left pleural effusion with chest xray    HPI: One month followup for benign left pleural effusion, probably related to diastolic heart failure. Patient was scheduled for Pleurx catheter but after a trial with oral Lasix 40 mg daily effusion significantly improved and a Pleurx was not needed.  Patient remains asymptomatic taking Lasix 40 mg daily No significant problems with cough shortness of breath  Past Medical History  Diagnosis Date  . Diabetes   . Prostate cancer   . Arthritis   . Anemia   . Dysrhythmia     "irregularities" in rhythm  . Osler hemorrhagic telangiectasia syndrome     Hx: of    Past Surgical History  Procedure Laterality Date  . Cataract extraction w/ intraocular lens  implant, bilateral      Hx: of  . Colonoscopy      HX: of    Family History  Problem Relation Age of Onset  . Allergies Daughter   . Heart disease Daughter   . Breast cancer Mother     Social History History  Substance Use Topics  . Smoking status: Former Smoker -- 0.50 packs/day for 30 years    Types: Cigarettes    Quit date: 05/12/1963  . Smokeless tobacco: Former Systems developer    Types: Chew     Comment: "Quit befor using cigarettes"  . Alcohol Use: Yes     Comment: very rare use    Current Outpatient Prescriptions  Medication Sig Dispense Refill  . b complex vitamins tablet Take 1 tablet by mouth daily.      . Calcium Carbonate (CALTRATE 600 PO) Take 1 capsule by mouth 2 (two) times daily.      . furosemide (LASIX) 40 MG tablet Take 40 mg by mouth daily.       . iron polysaccharides (NIFEREX) 150 MG capsule Take 150 mg by mouth 2 (two) times daily.      . metFORMIN (GLUCOPHAGE) 500 MG tablet Take 500 mg by mouth 2 (two) times daily with a meal.       . Multiple Vitamins-Minerals (CENTRUM SILVER PO) Take 1 tablet by mouth daily.      . ramipril  (ALTACE) 2.5 MG capsule Take 2.5 mg by mouth daily.        No current facility-administered medications for this visit.    Allergies  Allergen Reactions  . Aspirin Other (See Comments)    Causes GI bleeding    Review of Systems no fever no history of viral illness  BP 120/66  Pulse 86  Resp 16  Ht 5\' 11"  (1.803 m)  Wt 158 lb (71.668 kg)  BMI 22.05 kg/m2  SpO2 98% Physical Exam Alert and appropriate accompanied by daughter Lungs clear slightly diminished on left Heart rate sinus with isolated PVCs No pedal edema   Diagnostic Tests: Chest x-ray shows improvement in left pleural effusion  Impression: Stable nonmalignant left pleural effusion adequately controlled with oral Lasix-continue current therapy  Plan: Return in 6 weeks with chest x-ray

## 2013-09-01 ENCOUNTER — Other Ambulatory Visit: Payer: Self-pay | Admitting: *Deleted

## 2013-09-01 DIAGNOSIS — J90 Pleural effusion, not elsewhere classified: Secondary | ICD-10-CM

## 2013-09-06 ENCOUNTER — Ambulatory Visit: Payer: Medicare Other | Admitting: Cardiothoracic Surgery

## 2013-09-11 ENCOUNTER — Other Ambulatory Visit: Payer: Self-pay | Admitting: *Deleted

## 2013-09-11 DIAGNOSIS — J90 Pleural effusion, not elsewhere classified: Secondary | ICD-10-CM

## 2013-09-14 ENCOUNTER — Ambulatory Visit: Payer: Medicare Other | Admitting: Cardiothoracic Surgery

## 2013-09-18 ENCOUNTER — Ambulatory Visit: Payer: Medicare Other

## 2013-09-25 ENCOUNTER — Ambulatory Visit (INDEPENDENT_AMBULATORY_CARE_PROVIDER_SITE_OTHER): Payer: Medicare Other | Admitting: Physician Assistant

## 2013-09-25 ENCOUNTER — Ambulatory Visit
Admission: RE | Admit: 2013-09-25 | Discharge: 2013-09-25 | Disposition: A | Discharge status: Home / Self Care | Payer: Medicare Other | Source: Ambulatory Visit | Attending: Cardiothoracic Surgery | Admitting: Cardiothoracic Surgery

## 2013-09-25 VITALS — BP 107/64 | HR 77 | Resp 20 | Ht 71.0 in | Wt 158.0 lb

## 2013-09-25 DIAGNOSIS — J90 Pleural effusion, not elsewhere classified: Secondary | ICD-10-CM

## 2013-09-25 NOTE — Progress Notes (Signed)
  HPI:  Mr. Jason Lawson presents for 6 week follow up for pleural effusion.  He was last evaluated by Dr. Prescott Gum on 07/26/2013 at which time his CXR was stable in appearance and no Pleur-x catheter was indicated at that time.  The patient continues to do well.  He denies chest pain and shortness of breath.  He remains on Lasix.  Current Outpatient Prescriptions  Medication Sig Dispense Refill  . b complex vitamins tablet Take 1 tablet by mouth daily.      . Calcium Carbonate (CALTRATE 600 PO) Take 1 capsule by mouth 2 (two) times daily.      . furosemide (LASIX) 40 MG tablet Take 40 mg by mouth daily.       . iron polysaccharides (NIFEREX) 150 MG capsule Take 150 mg by mouth 2 (two) times daily.      . metFORMIN (GLUCOPHAGE) 500 MG tablet Take 500 mg by mouth 2 (two) times daily with a meal.       . Multiple Vitamins-Minerals (CENTRUM SILVER PO) Take 1 tablet by mouth daily.      . ramipril (ALTACE) 2.5 MG capsule Take 2.5 mg by mouth daily.        No current facility-administered medications for this visit.    Physical Exam:  BP 107/64  Pulse 77  Resp 20  Ht 5\' 11"  (1.803 m)  Wt 158 lb (71.668 kg)  BMI 22.05 kg/m2  SpO2 97%  Gen: no apparent distress Heart: RRR Lungs: CTA bilaterally  Diagnostic Tests:  CXR: stable appearance of left pleural effusion, atelectasis   1. Non-Malignant Pleural effusion- resolved, has no re-accumulated over last 6 weeks.   2. Continue Lasix 3. RTC in 2 months with CXR prior to appointment

## 2013-11-28 ENCOUNTER — Other Ambulatory Visit: Payer: Self-pay | Admitting: Cardiothoracic Surgery

## 2013-11-28 DIAGNOSIS — K5521 Angiodysplasia of colon with hemorrhage: Secondary | ICD-10-CM

## 2013-11-29 ENCOUNTER — Ambulatory Visit
Admission: RE | Admit: 2013-11-29 | Discharge: 2013-11-29 | Disposition: A | Discharge status: Home / Self Care | Payer: Medicare Other | Source: Ambulatory Visit | Attending: Cardiothoracic Surgery | Admitting: Cardiothoracic Surgery

## 2013-11-29 ENCOUNTER — Ambulatory Visit (INDEPENDENT_AMBULATORY_CARE_PROVIDER_SITE_OTHER): Payer: Medicare Other | Admitting: Cardiothoracic Surgery

## 2013-11-29 ENCOUNTER — Other Ambulatory Visit: Payer: Self-pay | Admitting: Cardiothoracic Surgery

## 2013-11-29 VITALS — BP 126/75 | HR 99 | Resp 16 | Ht 71.0 in | Wt 151.0 lb

## 2013-11-29 DIAGNOSIS — K5521 Angiodysplasia of colon with hemorrhage: Secondary | ICD-10-CM

## 2013-11-29 DIAGNOSIS — J90 Pleural effusion, not elsewhere classified: Secondary | ICD-10-CM

## 2013-12-01 NOTE — Progress Notes (Signed)
PCP is Sheela Stack, MD Referring Provider is Deneise Lever, MD  Chief Complaint  Patient presents with  . Pleural Effusion    left...2 month f/u with a cxr    HPI: The patient is a very nice 80 year old gentleman with slow decline in overall health. He developed a left pleural effusion on the basis of diastolic heart failure. It is mild to moderate with minimal symptoms. I elected not to place the Pleurx catheter but to treat him with oral diuretic therapy which has kept patient comfortable although he uses the bathroom frequently. He returns today with chest x-ray and routine followup. He takes his Lasix once every morning.   Past Medical History  Diagnosis Date  . Diabetes   . Prostate cancer   . Arthritis   . Anemia   . Dysrhythmia     "irregularities" in rhythm  . Osler hemorrhagic telangiectasia syndrome     Hx: of    Past Surgical History  Procedure Laterality Date  . Cataract extraction w/ intraocular lens  implant, bilateral      Hx: of  . Colonoscopy      HX: of    Family History  Problem Relation Age of Onset  . Allergies Daughter   . Heart disease Daughter   . Breast cancer Mother     Social History History  Substance Use Topics  . Smoking status: Former Smoker -- 0.50 packs/day for 30 years    Types: Cigarettes    Quit date: 05/12/1963  . Smokeless tobacco: Former Systems developer    Types: Chew     Comment: "Quit befor using cigarettes"  . Alcohol Use: Yes     Comment: very rare use    Current Outpatient Prescriptions  Medication Sig Dispense Refill  . b complex vitamins tablet Take 1 tablet by mouth daily.      . Calcium Carbonate (CALTRATE 600 PO) Take 1 capsule by mouth 2 (two) times daily.      . furosemide (LASIX) 40 MG tablet Take 40 mg by mouth daily.       . iron polysaccharides (NIFEREX) 150 MG capsule Take 150 mg by mouth 2 (two) times daily.      . metFORMIN (GLUCOPHAGE) 500 MG tablet Take 500 mg by mouth 2 (two) times daily with a meal.        . Multiple Vitamins-Minerals (CENTRUM SILVER PO) Take 1 tablet by mouth daily.      . ramipril (ALTACE) 2.5 MG capsule Take 2.5 mg by mouth daily.        No current facility-administered medications for this visit.    Allergies  Allergen Reactions  . Aspirin Other (See Comments)    Causes GI bleeding    Review of Systems patient is accompanied by his daughter he states his appetite is poor and he is losing weight.  BP 126/75  Pulse 99  Resp 16  Ht 5\' 11"  (1.803 m)  Wt 151 lb (68.493 kg)  BMI 21.07 kg/m2  SpO2 97% Physical Exam Alert and responsive but frail and thin Placed at bases Lungs with slightly diminished breath sounds at left base Heart rhythm irregular-A. fib Diagnostic Tests: Chest x-ray shows stable mild to moderate left pleural effusion  Impression: We'll continue to treat the patient without Pleurx catheter because of advanced age and minimal symptoms using daily diuretic.  Plan: Return chest x-ray in 3 months.

## 2014-01-05 ENCOUNTER — Ambulatory Visit (INDEPENDENT_AMBULATORY_CARE_PROVIDER_SITE_OTHER): Payer: Medicare Other | Admitting: Physician Assistant

## 2014-01-05 ENCOUNTER — Ambulatory Visit
Admission: RE | Admit: 2014-01-05 | Discharge: 2014-01-05 | Disposition: A | Discharge status: Home / Self Care | Payer: Medicare Other | Source: Ambulatory Visit | Attending: Surgery | Admitting: Surgery

## 2014-01-05 ENCOUNTER — Other Ambulatory Visit: Payer: Self-pay | Admitting: *Deleted

## 2014-01-05 VITALS — BP 96/52 | HR 76 | Resp 20 | Ht 71.0 in | Wt 150.0 lb

## 2014-01-05 DIAGNOSIS — J90 Pleural effusion, not elsewhere classified: Secondary | ICD-10-CM

## 2014-01-05 DIAGNOSIS — I482 Chronic atrial fibrillation, unspecified: Secondary | ICD-10-CM

## 2014-01-05 DIAGNOSIS — I4891 Unspecified atrial fibrillation: Secondary | ICD-10-CM

## 2014-01-05 NOTE — Progress Notes (Signed)
  HPI:  Jason Lawson presents today for evaluation due to worsening shortness of breath.  He is well known to Dr. Prescott Gum, being originally evaluated in October 2014, for recurrent left sided pleural effusion due to heart failure.  The patient was felt to not be a candidate for a pleur-x catheter and has been treated with Lasix daily.  The patient is accompanied by his daughter who states over the last week the patient has become more short of breath.  She also states that he is weak and has fallen on a couple of occasions.  The patient however, mainly stays in bed.  This is due to the Lasix causing him to urinate frequently and he is now wearing depends which cause him irritation.  Due to this he is mainly in bed all day.  Current Outpatient Prescriptions  Medication Sig Dispense Refill  . b complex vitamins tablet Take 1 tablet by mouth daily.      . Calcium Carbonate (CALTRATE 600 PO) Take 1 capsule by mouth 2 (two) times daily.      . furosemide (LASIX) 40 MG tablet Take 40 mg by mouth daily.       . iron polysaccharides (NIFEREX) 150 MG capsule Take 150 mg by mouth 2 (two) times daily.      . metFORMIN (GLUCOPHAGE) 500 MG tablet Take 500 mg by mouth 2 (two) times daily with a meal.       . Multiple Vitamins-Minerals (CENTRUM SILVER PO) Take 1 tablet by mouth daily.      . ramipril (ALTACE) 2.5 MG capsule Take 2.5 mg by mouth daily.        No current facility-administered medications for this visit.    Physical Exam:  BP 96/52  Pulse 76  Resp 20  Ht 5\' 11"  (1.803 m)  Wt 150 lb (68.04 kg)  BMI 20.93 kg/m2  SpO2 96%  Gen: patient alert, cooperative, not in distress Heart: Irregular rate and rhythm Lungs: Diminished left base, breathing non-labored  Diagnostic Tests:  CXR: left sided pleural effusion, no change in amount since CXR from 11/29/2013  A/P:  1. Chronic Left Sided pleural effusion- no change since 11/29/2013- continue Lasix 40 mg daily 2. Dyspnea- worsened over the last  week, associated with weakness, however patient is now staying in bed most of the day, this could be a result of deconditioning 3. Dispo- Unchanged pleural effusion from May.  I instructed patient to continue Lasix at this time.  I offered the patient to undergo Thoracentesis and stated I was unsure if this would be beneficial, due to the small amount of fluid present.  They declined this.  The other possibility would be should the patient be scanned for a Pulmonary embolism, being patient is now inactive.  However, I am concerned this is his issue with him maintaining good oxygen saturations of 96%.  I will speak with Dr. Prescott Gum regarding this patient.  He was instructed to contact our office should his symptoms progress, otherwise we will bring him back to clinic in 2 weeks with a repeat CXR.

## 2014-01-23 ENCOUNTER — Other Ambulatory Visit: Payer: Self-pay | Admitting: Cardiothoracic Surgery

## 2014-01-23 DIAGNOSIS — J90 Pleural effusion, not elsewhere classified: Secondary | ICD-10-CM

## 2014-01-24 ENCOUNTER — Encounter: Payer: Self-pay | Admitting: Cardiothoracic Surgery

## 2014-01-24 ENCOUNTER — Ambulatory Visit
Admission: RE | Admit: 2014-01-24 | Discharge: 2014-01-24 | Disposition: A | Discharge status: Home / Self Care | Payer: Medicare Other | Source: Ambulatory Visit | Attending: Cardiothoracic Surgery | Admitting: Cardiothoracic Surgery

## 2014-01-24 ENCOUNTER — Ambulatory Visit (INDEPENDENT_AMBULATORY_CARE_PROVIDER_SITE_OTHER): Payer: Medicare Other | Admitting: Cardiothoracic Surgery

## 2014-01-24 VITALS — BP 84/53 | HR 55 | Resp 20 | Ht 71.0 in | Wt 150.0 lb

## 2014-01-24 DIAGNOSIS — J90 Pleural effusion, not elsewhere classified: Secondary | ICD-10-CM

## 2014-01-24 NOTE — Progress Notes (Signed)
PCP is Sheela Stack, MD Referring Provider is Deneise Lever, MD  Chief Complaint  Patient presents with  . Pleural Effusion    2 week f/u with CXR    HPI: 80 year old extremely frail male presents for followup of a mild left pleural effusion. Patient is been followed since he had a thoracentesis of a large left pleural effusion returning 1.2 L. However with a daily Lasix dose the left pleural effusion has been controlled medically and a Pleurx catheter is not indicated and a repeat thoracentesis has not been needed. Patient slowly growing weaker losing weight and receiving some home physical therapy but is a significant fall risk. He leaks urine constantly and is wearing depends pads.  On exam today his breath sounds are slightly diminished at the left base and his chest x-ray shows a stable mild left pleural effusion. He has no peripheral edema his blood pressure somewhat soft. Will reduce his Lasix  to every other day.  The patient is in the process of being evaluated by hospice. Is not a candidate for surgical therapy. He is very close to the stage of needing comfort care and non  aggressive support. He is living with his daughter.  Past Medical History  Diagnosis Date  . Diabetes   . Prostate cancer   . Arthritis   . Anemia   . Dysrhythmia     "irregularities" in rhythm  . Osler hemorrhagic telangiectasia syndrome     Hx: of    Past Surgical History  Procedure Laterality Date  . Cataract extraction w/ intraocular lens  implant, bilateral      Hx: of  . Colonoscopy      HX: of    Family History  Problem Relation Age of Onset  . Allergies Daughter   . Heart disease Daughter   . Breast cancer Mother     Social History History  Substance Use Topics  . Smoking status: Former Smoker -- 0.50 packs/day for 30 years    Types: Cigarettes    Quit date: 05/12/1963  . Smokeless tobacco: Former Systems developer    Types: Chew     Comment: "Quit befor using cigarettes"  . Alcohol  Use: Yes     Comment: very rare use    Current Outpatient Prescriptions  Medication Sig Dispense Refill  . b complex vitamins tablet Take 1 tablet by mouth daily.      . Calcium Carbonate (CALTRATE 600 PO) Take 1 capsule by mouth 2 (two) times daily.      . furosemide (LASIX) 40 MG tablet Take 40 mg by mouth daily.       . iron polysaccharides (NIFEREX) 150 MG capsule Take 150 mg by mouth 2 (two) times daily.      . metFORMIN (GLUCOPHAGE) 500 MG tablet Take 500 mg by mouth 2 (two) times daily with a meal.       . mirtazapine (REMERON) 15 MG tablet Take 15 mg by mouth at bedtime.       . Multiple Vitamins-Minerals (CENTRUM SILVER PO) Take 1 tablet by mouth daily.      . ramipril (ALTACE) 2.5 MG capsule Take 2.5 mg by mouth daily.        No current facility-administered medications for this visit.    Allergies  Allergen Reactions  . Aspirin Other (See Comments)    Causes GI bleeding    Review of Systems progressive slow decline in overall strength and ability to thrive  BP 84/53  Pulse 55  Resp 20  Ht 5\' 11"  (1.803 m)  Wt 150 lb (68.04 kg)  BMI 20.93 kg/m2  SpO2 97% Physical Exam Frail ill male in wheelchair having trouble seeing straight Mild decreased breath sounds left base Chronic atrial fibrillation No ankle edema  Diagnostic Tests: No change in small left basilar pleural effusion  Impression: .Surgical candidate continue medical support with meds  Reduce Lasix to every other day. Patient return in 3 months with chest x-ray if he is able

## 2014-02-19 ENCOUNTER — Encounter (HOSPITAL_COMMUNITY): Payer: Self-pay | Admitting: Emergency Medicine

## 2014-02-19 ENCOUNTER — Emergency Department (HOSPITAL_COMMUNITY)
Admission: EM | Admit: 2014-02-19 | Discharge: 2014-02-19 | Disposition: A | Discharge status: Home / Self Care | Attending: Emergency Medicine | Admitting: Emergency Medicine

## 2014-02-19 DIAGNOSIS — Z8546 Personal history of malignant neoplasm of prostate: Secondary | ICD-10-CM | POA: Insufficient documentation

## 2014-02-19 DIAGNOSIS — Z79899 Other long term (current) drug therapy: Secondary | ICD-10-CM | POA: Insufficient documentation

## 2014-02-19 DIAGNOSIS — S0101XA Laceration without foreign body of scalp, initial encounter: Secondary | ICD-10-CM

## 2014-02-19 DIAGNOSIS — S0990XA Unspecified injury of head, initial encounter: Secondary | ICD-10-CM | POA: Insufficient documentation

## 2014-02-19 DIAGNOSIS — Z8739 Personal history of other diseases of the musculoskeletal system and connective tissue: Secondary | ICD-10-CM | POA: Insufficient documentation

## 2014-02-19 DIAGNOSIS — K921 Melena: Secondary | ICD-10-CM | POA: Insufficient documentation

## 2014-02-19 DIAGNOSIS — Y9289 Other specified places as the place of occurrence of the external cause: Secondary | ICD-10-CM | POA: Insufficient documentation

## 2014-02-19 DIAGNOSIS — W06XXXA Fall from bed, initial encounter: Secondary | ICD-10-CM | POA: Insufficient documentation

## 2014-02-19 DIAGNOSIS — Y9301 Activity, walking, marching and hiking: Secondary | ICD-10-CM | POA: Insufficient documentation

## 2014-02-19 DIAGNOSIS — S0100XA Unspecified open wound of scalp, initial encounter: Secondary | ICD-10-CM | POA: Insufficient documentation

## 2014-02-19 DIAGNOSIS — E119 Type 2 diabetes mellitus without complications: Secondary | ICD-10-CM | POA: Insufficient documentation

## 2014-02-19 DIAGNOSIS — W19XXXA Unspecified fall, initial encounter: Secondary | ICD-10-CM

## 2014-02-19 DIAGNOSIS — Z87891 Personal history of nicotine dependence: Secondary | ICD-10-CM | POA: Insufficient documentation

## 2014-02-19 DIAGNOSIS — D649 Anemia, unspecified: Secondary | ICD-10-CM | POA: Insufficient documentation

## 2014-02-19 DIAGNOSIS — K625 Hemorrhage of anus and rectum: Secondary | ICD-10-CM

## 2014-02-19 DIAGNOSIS — Z23 Encounter for immunization: Secondary | ICD-10-CM | POA: Insufficient documentation

## 2014-02-19 DIAGNOSIS — N390 Urinary tract infection, site not specified: Secondary | ICD-10-CM | POA: Insufficient documentation

## 2014-02-19 DIAGNOSIS — Z8679 Personal history of other diseases of the circulatory system: Secondary | ICD-10-CM | POA: Insufficient documentation

## 2014-02-19 LAB — BASIC METABOLIC PANEL
ANION GAP: 13 (ref 5–15)
BUN: 30 mg/dL — ABNORMAL HIGH (ref 6–23)
CHLORIDE: 103 meq/L (ref 96–112)
CO2: 24 mEq/L (ref 19–32)
CREATININE: 0.81 mg/dL (ref 0.50–1.35)
Calcium: 9.1 mg/dL (ref 8.4–10.5)
GFR, EST AFRICAN AMERICAN: 84 mL/min — AB (ref >90)
GFR, EST NON AFRICAN AMERICAN: 73 mL/min — AB (ref >90)
Glucose, Bld: 189 mg/dL — ABNORMAL HIGH (ref 70–99)
POTASSIUM: 4.5 meq/L (ref 3.7–5.3)
Sodium: 140 mEq/L (ref 137–147)

## 2014-02-19 LAB — CBC WITH DIFFERENTIAL/PLATELET
BASOS ABS: 0 10*3/uL (ref 0.0–0.1)
BASOS PCT: 0 % (ref 0–1)
Eosinophils Absolute: 0 10*3/uL (ref 0.0–0.7)
Eosinophils Relative: 0 % (ref 0–5)
HCT: 32.8 % — ABNORMAL LOW (ref 39.0–52.0)
Hemoglobin: 9.9 g/dL — ABNORMAL LOW (ref 13.0–17.0)
Lymphocytes Relative: 4 % — ABNORMAL LOW (ref 12–46)
Lymphs Abs: 0.5 10*3/uL — ABNORMAL LOW (ref 0.7–4.0)
MCH: 24.6 pg — ABNORMAL LOW (ref 26.0–34.0)
MCHC: 30.2 g/dL (ref 30.0–36.0)
MCV: 81.6 fL (ref 78.0–100.0)
Monocytes Absolute: 1.3 10*3/uL — ABNORMAL HIGH (ref 0.1–1.0)
Monocytes Relative: 9 % (ref 3–12)
NEUTROS PCT: 87 % — AB (ref 43–77)
Neutro Abs: 13 10*3/uL — ABNORMAL HIGH (ref 1.7–7.7)
Platelets: 418 10*3/uL — ABNORMAL HIGH (ref 150–400)
RBC: 4.02 MIL/uL — ABNORMAL LOW (ref 4.22–5.81)
RDW: 19.5 % — AB (ref 11.5–15.5)
WBC: 14.9 10*3/uL — ABNORMAL HIGH (ref 4.0–10.5)

## 2014-02-19 LAB — URINE MICROSCOPIC-ADD ON

## 2014-02-19 LAB — URINALYSIS, ROUTINE W REFLEX MICROSCOPIC
Bilirubin Urine: NEGATIVE
Glucose, UA: NEGATIVE mg/dL
Ketones, ur: NEGATIVE mg/dL
Nitrite: NEGATIVE
PROTEIN: NEGATIVE mg/dL
Specific Gravity, Urine: 1.016 (ref 1.005–1.030)
UROBILINOGEN UA: 0.2 mg/dL (ref 0.0–1.0)
pH: 8 (ref 5.0–8.0)

## 2014-02-19 LAB — POC OCCULT BLOOD, ED: Fecal Occult Bld: POSITIVE — AB

## 2014-02-19 LAB — TROPONIN I: Troponin I: <0.3 ng/mL (ref <0.30)

## 2014-02-19 MED ORDER — SODIUM CHLORIDE 0.9 % IV SOLN
INTRAVENOUS | Status: DC
  Administered 2014-02-19: 08:00:00 via INTRAVENOUS

## 2014-02-19 MED ORDER — SODIUM CHLORIDE 0.9 % IV SOLN: INTRAVENOUS | Status: DC

## 2014-02-19 MED ORDER — METOPROLOL TARTRATE 1 MG/ML IV SOLN
2.5000 mg | INTRAVENOUS | Status: DC | PRN
  Filled 2014-02-19: qty 5

## 2014-02-19 MED ORDER — CEPHALEXIN 500 MG PO CAPS: 500.0000 mg | ORAL_CAPSULE | Freq: Four times a day (QID) | ORAL | Status: DC

## 2014-02-19 MED ORDER — TETANUS-DIPHTH-ACELL PERTUSSIS 5-2.5-18.5 LF-MCG/0.5 IM SUSP
0.5000 mL | Freq: Once | INTRAMUSCULAR | Status: AC
  Administered 2014-02-19: 0.5 mL via INTRAMUSCULAR
  Filled 2014-02-19: qty 0.5

## 2014-02-19 NOTE — ED Provider Notes (Signed)
CSN: [id number]     Arrival date & time 02/19/14  0712 History   First MD Initiated Contact with Patient 02/19/14 [phone]     Chief Complaint  Patient presents with  . Head Laceration     (Consider location/radiation/quality/duration/timing/severity/associated sxs/prior Treatment) HPI\  Jason Lawson is a 80 y.o. male who presents for evaluation of injury and fall. He, states that he is walking, next to his bed, when he became tangled in sheets. This caused him to fall and injured the back of his head. He was not able to get up on his own. He is assisted by EMS and transferred here without any specific treatment. The patient has a MOST form, and is DO NOT RESUSCITATE. They're been no recent illnesses. There are no other known modifying factors.   Past Medical History  Diagnosis Date  . Diabetes   . Prostate cancer   . Arthritis   . Anemia   . Dysrhythmia     "irregularities" in rhythm  . Osler hemorrhagic telangiectasia syndrome     Hx: of   Past Surgical History  Procedure Laterality Date  . Cataract extraction w/ intraocular lens  implant, bilateral      Hx: of  . Colonoscopy      HX: of   Family History  Problem Relation Age of Onset  . Allergies Daughter   . Heart disease Daughter   . Breast cancer Mother    History  Substance Use Topics  . Smoking status: Former Smoker -- 0.50 packs/day for 30 years    Types: Cigarettes    Quit date: 05/12/1963  . Smokeless tobacco: Former Systems developer    Types: Chew     Comment: "Quit befor using cigarettes"  . Alcohol Use: Yes     Comment: very rare use    Review of Systems  All other systems reviewed and are negative.     Allergies  Aspirin  Home Medications   Prior to Admission medications   Medication Sig Start Date End Date Taking? Authorizing Provider  b complex vitamins tablet Take 1 tablet by mouth daily.   Yes Historical Provider, MD  calcium citrate-vitamin D (CITRACAL+D) 315-200 MG-UNIT per tablet Take 1 tablet by  mouth 2 (two) times daily.   Yes Historical Provider, MD  furosemide (LASIX) 40 MG tablet Take 40 mg by mouth every other day.  05/01/13  Yes Historical Provider, MD  iron polysaccharides (NIFEREX) 150 MG capsule Take 150 mg by mouth 2 (two) times daily.   Yes Historical Provider, MD  metFORMIN (GLUCOPHAGE) 500 MG tablet Take 500 mg by mouth 2 (two) times daily with a meal.  04/03/13  Yes Historical Provider, MD  mirtazapine (REMERON) 15 MG tablet Take 15 mg by mouth at bedtime.  01/02/14  Yes Historical Provider, MD  Multiple Vitamins-Minerals (CENTRUM SILVER PO) Take 1 tablet by mouth daily.   Yes Historical Provider, MD  ramipril (ALTACE) 2.5 MG capsule Take 2.5 mg by mouth daily.  05/07/13  Yes Historical Provider, MD  cephALEXin (KEFLEX) 500 MG capsule Take 1 capsule (500 mg total) by mouth 4 (four) times daily. 02/19/14   Richarda Blade, MD   BP 117/70  Pulse 91  Temp(Src) 97.9 F (36.6 C) (Oral)  Resp 25  SpO2 97% Physical Exam  Nursing note and vitals reviewed. Constitutional: He is oriented to person, place, and time. He appears well-developed and well-nourished.  Appears younger than stated age  HENT:  Head: Normocephalic and atraumatic.  Right Ear:  External ear normal.  Left Ear: External ear normal.  Eyes: Conjunctivae and EOM are normal. Pupils are equal, round, and reactive to light.  Neck: Normal range of motion and phonation normal. Neck supple.  Cardiovascular: Normal rate, regular rhythm, normal heart sounds and intact distal pulses.   Pulmonary/Chest: Effort normal and breath sounds normal. He exhibits no bony tenderness.  Abdominal: Soft. There is no tenderness.  Genitourinary:  Normal anus. Black stool in rectum, no mass or impaction  Musculoskeletal: Normal range of motion.  Neurological: He is alert and oriented to person, place, and time. No cranial nerve deficit or sensory deficit. He exhibits normal muscle tone. Coordination normal.  Skin: Skin is warm, dry and  intact.  Psychiatric: He has a normal mood and affect. His behavior is normal. Judgment and thought content normal.    ED Course  Procedures (including critical care time) Medications  0.9 %  sodium chloride infusion (not administered)  0.9 %  sodium chloride infusion ( Intravenous New Bag/Given 02/19/14 0812)  metoprolol (LOPRESSOR) injection 2.5 mg (not administered)  Tdap (BOOSTRIX) injection 0.5 mL (0.5 mLs Intramuscular Given 02/19/14 0929)    Patient Vitals for the past 24 hrs:  BP Temp Temp src Pulse Resp SpO2  02/19/14 0915 117/70 mmHg - - 91 25 97 %  02/19/14 0830 127/73 mmHg - - 94 18 100 %  02/19/14 0810 131/76 mmHg - - 97 15 100 %  02/19/14 0758 121/78 mmHg 97.9 F (36.6 C) Oral 95 24 98 %  02/19/14 0730 121/78 mmHg - - 137 25 96 %  02/19/14 0723 121/80 mmHg 98.3 F (36.8 C) - 148 18 98 %   LACERATION REPAIR Performed by: Richarda Blade Consent: Verbal consent obtained. Risks and benefits: risks, benefits and alternatives were discussed Patient identity confirmed: provided demographic data Time out performed prior to procedure Prepped and Draped in normal sterile fashion Wound explored Laceration Location: left parietal scalp Laceration Length: 6.0 cm No Foreign Bodies seen or palpated Anesthesia: local infiltration Local anesthetic: lidocaine 2% with epinephrine Anesthetic total: 5 ml Irrigation method: syringe Amount of cleaning: standard Skin closure: 3.0 Prolene Number of sutures or staples: 5 Technique: simple Patient tolerance: Patient tolerated the procedure well with no immediate complications.  11:13 AM Reevaluation with update and discussion. After initial assessment and treatment, an updated evaluation reveals he is comfortable and calm. Repeat vitals are reassuring. I discussed with patient and daughter, all questions answered.[name]   Labs Review Labs Reviewed  CBC WITH DIFFERENTIAL - Abnormal; Notable for the following:    WBC 14.9  (*)    RBC 4.02 (*)    Hemoglobin 9.9 (*)    HCT 32.8 (*)    MCH 24.6 (*)    RDW 19.5 (*)    Platelets 418 (*)    Neutrophils Relative % 87 (*)    Neutro Abs 13.0 (*)    Lymphocytes Relative 4 (*)    Lymphs Abs 0.5 (*)    Monocytes Absolute 1.3 (*)    All other components within normal limits  BASIC METABOLIC PANEL - Abnormal; Notable for the following:    Glucose, Bld 189 (*)    BUN 30 (*)    GFR calc non Af Amer 73 (*)    GFR calc Af Amer 84 (*)    All other components within normal limits  URINALYSIS, ROUTINE W REFLEX MICROSCOPIC - Abnormal; Notable for the following:    APPearance HAZY (*)    Hgb urine  dipstick TRACE (*)    Leukocytes, UA LARGE (*)    All other components within normal limits  URINE MICROSCOPIC-ADD ON - Abnormal; Notable for the following:    Bacteria, UA MANY (*)    Crystals CA OXALATE CRYSTALS (*)    All other components within normal limits  POC OCCULT BLOOD, ED - Abnormal; Notable for the following:    Fecal Occult Bld POSITIVE (*)    All other components within normal limits  URINE CULTURE  TROPONIN I    Imaging Review No results found.   EKG Interpretation   Date/Time:  Monday February 19 2014 07:47:03 EDT Ventricular Rate:  101 PR Interval:  202 QRS Duration: 112 QT Interval:  357 QTC Calculation: 463 R Axis:   -72 Text Interpretation:  Sinus tachycardia Ventricular premature complex  Incomplete RBBB and LAFB Anteroseptal infarct, age indeterminate Since  last tracing of earlier today rate slower and PAC with abberant conduction  and compensatory pause Confirmed by Eulis Foster  MD, Laylynn Campanella [phone]) on 02/19/2014  8:16:12 AM      MDM   Final diagnoses:  Fall, initial encounter  Head injury, initial encounter  Laceration of scalp, initial encounter  Urinary tract infection without hematuria, site unspecified  Anemia, unspecified anemia type  Rectal bleeding    Fall, multifactorial, without, serious injury. Incidental notice of anemia,  likely due to chronic GI bleed, associated with known intestinal disorder. Likely urinary tract infection. No hemodynamic instability, or suspected, serious bacterial infection.  Nursing Notes Reviewed/ Care Coordinated Applicable Imaging Reviewed Interpretation of Laboratory Data incorporated into ED treatment  The patient appears reasonably screened and/or stabilized for discharge and I doubt any other medical condition or other [facility] requiring further screening, evaluation, or treatment in the ED at this time prior to discharge.  Plan: Home Medications- Keflex; Home Treatments- rest, fluids; return here if the recommended treatment, does not improve the symptoms; Recommended follow up- PCP 1 week and prn     Richarda Blade, MD 02/19/14 1115

## 2014-02-19 NOTE — ED Notes (Signed)
Per EMS, pt was tangled up in the sheets this am and fell and hit his head. Pt has lac to the back of head. Bleeding controlled. Pt HR in the 148. Pt refused IV and any care related to his HR.  Pt A&Ox3

## 2014-02-19 NOTE — Progress Notes (Signed)
Pt currently followed by [facility]) for Dx: Heart Disease;  Per EPIC/ ED Physician note pt is a DNR; Brief visit at ED Rm A11 pt came to ED after fall at home;Currently pt alert, oriented, denied pain stated he had some sutures to the back of his head; daughter Bethena Roys at bedside and informed the plan was for her dad to return home by transport; arrangements being made- staff RN Janett Billow informed pt ordered Keflex for UTI and prescription to be called to pt's pharmacy per daughter Judy's request. Bethena Roys and Mr Safley aware [facility] will follow up with patient once he is at home. Please contact [facility] at [phone] with any concerns or questions Danton Sewer, [facility] Liaison  [phone]

## 2014-02-19 NOTE — ED Notes (Signed)
Pt alert x4 respirations easy non labored. Skin warm dry transported via [facility]

## 2014-02-19 NOTE — Discharge Instructions (Signed)
Anemia, Nonspecific Anemia is a condition in which the concentration of red blood cells or hemoglobin in the blood is below normal. Hemoglobin is a substance in red blood cells that carries oxygen to the tissues of the body. Anemia results in not enough oxygen reaching these tissues.  CAUSES  Common causes of anemia include:   Excessive bleeding. Bleeding may be internal or external. This includes excessive bleeding from periods (in women) or from the intestine.   Poor nutrition.   Chronic kidney, thyroid, and liver disease.  Bone marrow disorders that decrease red blood cell production.  Cancer and treatments for cancer.  HIV, AIDS, and their treatments.  Spleen problems that increase red blood cell destruction.  Blood disorders.  Excess destruction of red blood cells due to infection, medicines, and autoimmune disorders. SIGNS AND SYMPTOMS   Minor weakness.   Dizziness.   Headache.  Palpitations.   Shortness of breath, especially with exercise.   Paleness.  Cold sensitivity.  Indigestion.  Nausea.  Difficulty sleeping.  Difficulty concentrating. Symptoms may occur suddenly or they may develop slowly.  DIAGNOSIS  Additional blood tests are often needed. These help your health care provider determine the best treatment. Your health care provider will check your stool for blood and look for other causes of blood loss.  TREATMENT  Treatment varies depending on the cause of the anemia. Treatment can include:   Supplements of iron, vitamin N39, or folic acid.   Hormone medicines.   A blood transfusion. This may be needed if blood loss is severe.   Hospitalization. This may be needed if there is significant continual blood loss.   Dietary changes.  Spleen removal. HOME CARE INSTRUCTIONS Keep all follow-up appointments. It often takes many weeks to correct anemia, and having your health care provider check on your condition and your response to  treatment is very important. SEEK IMMEDIATE MEDICAL CARE IF:   You develop extreme weakness, shortness of breath, or chest pain.   You become dizzy or have trouble concentrating.  You develop heavy vaginal bleeding.   You develop a rash.   You have bloody or black, tarry stools.   You faint.   You vomit up blood.   You vomit repeatedly.   You have abdominal pain.  You have a fever or persistent symptoms for more than 2-3 days.   You have a fever and your symptoms suddenly get worse.   You are dehydrated.  MAKE SURE YOU:  Understand these instructions.  Will watch your condition.  Will get help right away if you are not doing well or get worse. Document Released: 08/13/2004 Document Revised: 03/08/2013 Document Reviewed: 12/30/2012 [facility] Patient Information 2015 [place], [place]. This information is not intended to replace advice given to you by your health care provider. Make sure you discuss any questions you have with your health care provider.  Fall Prevention and Home Safety Falls cause injuries and can affect all age groups. It is possible to use preventive measures to significantly decrease the likelihood of falls. There are many simple measures which can make your home safer and prevent falls. OUTDOORS  Repair cracks and edges of walkways and driveways.  Remove high doorway thresholds.  Trim shrubbery on the main path into your home.  Have good outside lighting.  Clear walkways of tools, rocks, debris, and clutter.  Check that handrails are not broken and are securely fastened. Both sides of steps should have handrails.  Have leaves, snow, and ice cleared regularly.  Use sand or salt on walkways during winter months.  In the garage, clean up grease or oil spills. BATHROOM  Install night lights.  Install grab bars by the toilet and in the tub and shower.  Use non-skid mats or decals in the tub or shower.  Place a plastic non-slip  stool in the shower to sit on, if needed.  Keep floors dry and clean up all water on the floor immediately.  Remove soap buildup in the tub or shower on a regular basis.  Secure bath mats with non-slip, double-sided rug tape.  Remove throw rugs and tripping hazards from the floors. BEDROOMS  Install night lights.  Make sure a bedside light is easy to reach.  Do not use oversized bedding.  Keep a telephone by your bedside.  Have a firm chair with side arms to use for getting dressed.  Remove throw rugs and tripping hazards from the floor. KITCHEN  Keep handles on pots and pans turned toward the center of the stove. Use back burners when possible.  Clean up spills quickly and allow time for drying.  Avoid walking on wet floors.  Avoid hot utensils and knives.  Position shelves so they are not too high or low.  Place commonly used objects within easy reach.  If necessary, use a sturdy step stool with a grab bar when reaching.  Keep electrical cables out of the way.  Do not use floor polish or wax that makes floors slippery. If you must use wax, use non-skid floor wax.  Remove throw rugs and tripping hazards from the floor. STAIRWAYS  Never leave objects on stairs.  Place handrails on both sides of stairways and use them. Fix any loose handrails. Make sure handrails on both sides of the stairways are as long as the stairs.  Check carpeting to make sure it is firmly attached along stairs. Make repairs to worn or loose carpet promptly.  Avoid placing throw rugs at the top or bottom of stairways, or properly secure the rug with carpet tape to prevent slippage. Get rid of throw rugs, if possible.  Have an electrician put in a light switch at the top and bottom of the stairs. OTHER FALL PREVENTION TIPS  Wear low-heel or rubber-soled shoes that are supportive and fit well. Wear closed toe shoes.  When using a stepladder, make sure it is fully opened and both spreaders  are firmly locked. Do not climb a closed stepladder.  Add color or contrast paint or tape to grab bars and handrails in your home. Place contrasting color strips on first and last steps.  Learn and use mobility aids as needed. Install an electrical emergency response system.  Turn on lights to avoid dark areas. Replace light bulbs that burn out immediately. Get light switches that glow.  Arrange furniture to create clear pathways. Keep furniture in the same place.  Firmly attach carpet with non-skid or double-sided tape.  Eliminate uneven floor surfaces.  Select a carpet pattern that does not visually hide the edge of steps.  Be aware of all pets. OTHER HOME SAFETY TIPS  Set the water temperature for 120 F (48.8 C).  Keep emergency numbers on or near the telephone.  Keep smoke detectors on every level of the home and near sleeping areas. Document Released: 06/26/2002 Document Revised: 01/05/2012 Document Reviewed: 09/25/2011 [facility] Patient Information 2015 [place], [place]. This information is not intended to replace advice given to you by your health care provider. Make sure you discuss any  questions you have with your health care provider.  Head Injury You have received a head injury. It does not appear serious at this time. Headaches and vomiting are common following head injury. It should be easy to awaken from sleeping. Sometimes it is necessary for you to stay in the emergency department for a while for observation. Sometimes admission to the hospital may be needed. After injuries such as yours, most problems occur within the first 24 hours, but side effects may occur up to 7-10 days after the injury. It is important for you to carefully monitor your condition and contact your health care provider or seek immediate medical care if there is a change in your condition. WHAT ARE THE TYPES OF HEAD INJURIES? Head injuries can be as minor as a bump. Some head injuries can be more  severe. More severe head injuries include:  A jarring injury to the brain (concussion).  A bruise of the brain (contusion). This mean there is bleeding in the brain that can cause swelling.  A cracked skull (skull fracture).  Bleeding in the brain that collects, clots, and forms a bump (hematoma). WHAT CAUSES A HEAD INJURY? A serious head injury is most likely to happen to someone who is in a car wreck and is not wearing a seat belt. Other causes of major head injuries include bicycle or motorcycle accidents, sports injuries, and falls. HOW ARE HEAD INJURIES DIAGNOSED? A complete history of the event leading to the injury and your current symptoms will be helpful in diagnosing head injuries. Many times, pictures of the brain, such as CT or MRI are needed to see the extent of the injury. Often, an overnight hospital stay is necessary for observation.  WHEN SHOULD I SEEK IMMEDIATE MEDICAL CARE?  You should get help right away if:  You have confusion or drowsiness.  You feel sick to your stomach (nauseous) or have continued, forceful vomiting.  You have dizziness or unsteadiness that is getting worse.  You have severe, continued headaches not relieved by medicine. Only take over-the-counter or prescription medicines for pain, fever, or discomfort as directed by your health care provider.  You do not have normal function of the arms or legs or are unable to walk.  You notice changes in the black spots in the center of the colored part of your eye (pupil).  You have a clear or bloody fluid coming from your nose or ears.  You have a loss of vision. During the next 24 hours after the injury, you must stay with someone who can watch you for the warning signs. This person should contact local emergency services (911 in the [place] if you have seizures, you become unconscious, or you are unable to wake up. HOW CAN I PREVENT A HEAD INJURY IN THE FUTURE? The most important factor for preventing  major head injuries is avoiding motor vehicle accidents. To minimize the potential for damage to your head, it is crucial to wear seat belts while riding in motor vehicles. Wearing helmets while bike riding and playing collision sports (like football) is also helpful. Also, avoiding dangerous activities around the house will further help reduce your risk of head injury.  WHEN CAN I RETURN TO NORMAL ACTIVITIES AND ATHLETICS? You should be reevaluated by your health care provider before returning to these activities. If you have any of the following symptoms, you should not return to activities or contact sports until 1 week after the symptoms have stopped:  Persistent headache.  Dizziness  or vertigo.  Poor attention and concentration.  Confusion.  Memory problems.  Nausea or vomiting.  Fatigue or tire easily.  Irritability.  Intolerant of bright lights or loud noises.  Anxiety or depression.  Disturbed sleep. MAKE SURE YOU:   Understand these instructions.  Will watch your condition.  Will get help right away if you are not doing well or get worse. Document Released: 07/06/2005 Document Revised: 07/11/2013 Document Reviewed: 03/13/2013 [facility] Patient Information 2015 [place], [place]. This information is not intended to replace advice given to you by your health care provider. Make sure you discuss any questions you have with your health care provider.  Sutured Wound Care Sutures are stitches that can be used to close wounds. Wound care helps prevent pain and infection.  HOME CARE INSTRUCTIONS   Rest and elevate the injured area until all the pain and swelling are gone.  Only take over-the-counter or prescription medicines for pain, discomfort, or fever as directed by your caregiver.  After 48 hours, gently wash the area with mild soap and water once a day, or as directed. Rinse off the soap. Pat the area dry with a clean towel. Do not rub the wound. This may cause  bleeding.  Follow your caregiver's instructions for how often to change the bandage (dressing). Stop using a dressing after 2 days or after the wound stops draining.  If the dressing sticks, moisten it with soapy water and gently remove it.  Apply ointment on the wound as directed.  Avoid stretching a sutured wound.  Drink enough fluids to keep your urine clear or pale yellow.  Follow up with your caregiver for suture removal as directed.  Use sunscreen on your wound for the next 3 to 6 months so the scar will not darken. SEEK IMMEDIATE MEDICAL CARE IF:   Your wound becomes red, swollen, hot, or tender.  You have increasing pain in the wound.  You have a red streak that extends from the wound.  There is pus coming from the wound.  You have a fever.  You have shaking chills.  There is a bad smell coming from the wound.  You have persistent bleeding from the wound. MAKE SURE YOU:   Understand these instructions.  Will watch your condition.  Will get help right away if you are not doing well or get worse. Document Released: 08/13/2004 Document Revised: 09/28/2011 Document Reviewed: 11/09/2010 [facility] Patient Information 2015 [place], [place]. This information is not intended to replace advice given to you by your health care provider. Make sure you discuss any questions you have with your health care provider.

## 2014-02-20 LAB — URINE CULTURE: Colony Count: 25000

## 2014-03-07 ENCOUNTER — Ambulatory Visit: Payer: Medicare Other | Admitting: Cardiothoracic Surgery

## 2014-05-02 ENCOUNTER — Ambulatory Visit: Payer: Medicare Other | Admitting: Cardiothoracic Surgery

## 2014-09-17 ENCOUNTER — Ambulatory Visit (INDEPENDENT_AMBULATORY_CARE_PROVIDER_SITE_OTHER): Payer: Medicare Other | Admitting: Podiatry

## 2014-09-17 ENCOUNTER — Encounter: Payer: Self-pay | Admitting: Podiatry

## 2014-09-17 VITALS — BP 118/76 | HR 76 | Resp 14

## 2014-09-17 DIAGNOSIS — L84 Corns and callosities: Secondary | ICD-10-CM

## 2014-09-17 DIAGNOSIS — B351 Tinea unguium: Secondary | ICD-10-CM

## 2014-09-17 DIAGNOSIS — E0841 Diabetes mellitus due to underlying condition with diabetic mononeuropathy: Secondary | ICD-10-CM

## 2014-09-17 NOTE — Progress Notes (Signed)
   Subjective:    Patient ID: Jason Lawson, male    DOB: Dec 03, 1915, 81 y.o.   MRN: [id number]  HPI Comments: N diabetic foot exam and debridement L 10 toenails, and B/L 1st toes pre-ulcerative callouses - 3 months D and O long- term C callouses are pre-ulcerative A diabetic pt and difficulty caring for feet T changed shoes, [facility] caregiver referred.  Diabetes      Review of Systems  All other systems reviewed and are negative.      Objective:   Physical Exam Pleasant orientated 3 patient presents with caregiver present in the room. He denies any history of foot ulcers or claudication  Vascular: DP pulses 2/4 bilaterally PT pulses 0/4 bilaterally Mild pitting edema dorsal feet bilaterally  Neurological: Sensation to 10 g monofilament wire intact 2/5 bilaterally Vibratory sensation nonreactive bilaterally Ankle reflexes reactive bilaterally  Dermatological: Atrophic skin without any hair growth bilaterally Hemorrhagic callus distal hallux bilaterally The toenails are brittle, elongated, discolored 6-10  Musculoskeletal: There is no restriction ankle, subtalar, midtarsal joint Patient is able to walk slowly with assistance of a cane      Assessment & Plan:   Assessment: Diminished pedal pulses consistent with peripheral arterial disease Diabetic peripheral neuropathy Onychomycoses 6-10 Pre-ulcerative callus hallux bilaterally  Plan: Debridement toenails 10 Debride distal keratoses hallux bilaterally, apply protective foam pad around the distal hallux and attach with Coflex tape  Patient's caregiver will apply topical antibiotic ointment, protective foam pad and attach with Coflex tape daily  Reappoint 2 weeks

## 2014-09-17 NOTE — Patient Instructions (Signed)
Attach foam pad around bleeding callus on the right and big toe daily, cover with antibiotic ointment, and attach with one-inch Coflex tape do not overtighten  Diabetes and Foot Care Diabetes may cause you to have problems because of poor blood supply (circulation) to your feet and legs. This may cause the skin on your feet to become thinner, break easier, and heal more slowly. Your skin may become dry, and the skin may peel and crack. You may also have nerve damage in your legs and feet causing decreased feeling in them. You may not notice minor injuries to your feet that could lead to infections or more serious problems. Taking care of your feet is one of the most important things you can do for yourself.  HOME CARE INSTRUCTIONS  Wear shoes at all times, even in the house. Do not go barefoot. Bare feet are easily injured.  Check your feet daily for blisters, cuts, and redness. If you cannot see the bottom of your feet, use a mirror or ask someone for help.  Wash your feet with warm water (do not use hot water) and mild soap. Then pat your feet and the areas between your toes until they are completely dry. Do not soak your feet as this can dry your skin.  Apply a moisturizing lotion or petroleum jelly (that does not contain alcohol and is unscented) to the skin on your feet and to dry, brittle toenails. Do not apply lotion between your toes.  Trim your toenails straight across. Do not dig under them or around the cuticle. File the edges of your nails with an emery board or nail file.  Do not cut corns or calluses or try to remove them with medicine.  Wear clean socks or stockings every day. Make sure they are not too tight. Do not wear knee-high stockings since they may decrease blood flow to your legs.  Wear shoes that fit properly and have enough cushioning. To break in new shoes, wear them for just a few hours a day. This prevents you from injuring your feet. Always look in your shoes before  you put them on to be sure there are no objects inside.  Do not cross your legs. This may decrease the blood flow to your feet.  If you find a minor scrape, cut, or break in the skin on your feet, keep it and the skin around it clean and dry. These areas may be cleansed with mild soap and water. Do not cleanse the area with peroxide, alcohol, or iodine.  When you remove an adhesive bandage, be sure not to damage the skin around it.  If you have a wound, look at it several times a day to make sure it is healing.  Do not use heating pads or hot water bottles. They may burn your skin. If you have lost feeling in your feet or legs, you may not know it is happening until it is too late.  Make sure your health care provider performs a complete foot exam at least annually or more often if you have foot problems. Report any cuts, sores, or bruises to your health care provider immediately. SEEK MEDICAL CARE IF:   You have an injury that is not healing.  You have cuts or breaks in the skin.  You have an ingrown nail.  You notice redness on your legs or feet.  You feel burning or tingling in your legs or feet.  You have pain or cramps in your  legs and feet.  Your legs or feet are numb.  Your feet always feel cold. SEEK IMMEDIATE MEDICAL CARE IF:   There is increasing redness, swelling, or pain in or around a wound.  There is a red line that goes up your leg.  Pus is coming from a wound.  You develop a fever or as directed by your health care provider.  You notice a bad smell coming from an ulcer or wound. Document Released: 07/03/2000 Document Revised: 03/08/2013 Document Reviewed: 12/13/2012 [facility] Patient Information 2015 [place], [place]. This information is not intended to replace advice given to you by your health care provider. Make sure you discuss any questions you have with your health care provider.

## 2014-09-21 DIAGNOSIS — B351 Tinea unguium: Secondary | ICD-10-CM

## 2014-10-01 ENCOUNTER — Ambulatory Visit: Payer: Medicare Other | Admitting: Podiatry

## 2014-10-03 ENCOUNTER — Ambulatory Visit (INDEPENDENT_AMBULATORY_CARE_PROVIDER_SITE_OTHER): Payer: Medicare Other | Admitting: Podiatry

## 2014-10-03 ENCOUNTER — Encounter: Payer: Self-pay | Admitting: Podiatry

## 2014-10-03 VITALS — BP 105/74 | HR 86 | Temp 97.2°F | Resp 12

## 2014-10-03 DIAGNOSIS — G629 Polyneuropathy, unspecified: Secondary | ICD-10-CM | POA: Diagnosis not present

## 2014-10-03 DIAGNOSIS — E1342 Other specified diabetes mellitus with diabetic polyneuropathy: Secondary | ICD-10-CM

## 2014-10-03 DIAGNOSIS — L84 Corns and callosities: Secondary | ICD-10-CM

## 2014-10-03 DIAGNOSIS — E1142 Type 2 diabetes mellitus with diabetic polyneuropathy: Secondary | ICD-10-CM

## 2014-10-03 NOTE — Patient Instructions (Signed)
Lelan Pons, caregiver instructions: Apply protective pad to the ends of the right and left great toes daily, apply small amount of antibiotic ointment and attach with Coflex daily until the skin looks normal

## 2014-10-03 NOTE — Progress Notes (Signed)
Patient ID: Jason Lawson, male   DOB: [birth year], 81 y.o.   MRN: [id number]  Subjective: This patient presents for follow-up visit of 09/17/2014. Pre-ulcerative calluses were noted distal hallux at that time. Patient's caregiver Lelan Pons is present in the room today and has been applying protective pad seen antibiotic ointment to the pre-ulcerative callus on the right and left hallux daily.  Objective: The distal right hallux is a 5 mm hemorrhagic callus without any surrounding erythema, edema or warmth The distal left hallux has dry crusted area without any erythema, edema or drainage  Assessment: Pre-ulcerative callus distal hallux bilaterally without any clinical sign of infection  Plan: Debrided pre-ulcerative callus hallux left Instructed patient skin of her Lelan Pons to apply protective felt pad and antibiotic ointment to the distal hallux bilaterally, daily until the skin looks normal.  Reappoint as needed

## 2014-10-12 ENCOUNTER — Encounter (HOSPITAL_COMMUNITY): Payer: Self-pay | Admitting: Emergency Medicine

## 2014-10-12 ENCOUNTER — Other Ambulatory Visit (HOSPITAL_COMMUNITY): Payer: Self-pay

## 2014-10-12 ENCOUNTER — Inpatient Hospital Stay (HOSPITAL_COMMUNITY)
Admission: EM | Admit: 2014-10-12 | Discharge: 2014-10-19 | DRG: 871 | Disposition: E | Payer: Medicare Other | Attending: Internal Medicine | Admitting: Internal Medicine

## 2014-10-12 ENCOUNTER — Emergency Department (HOSPITAL_COMMUNITY): Payer: Medicare Other

## 2014-10-12 DIAGNOSIS — K921 Melena: Secondary | ICD-10-CM | POA: Diagnosis present

## 2014-10-12 DIAGNOSIS — E43 Unspecified severe protein-calorie malnutrition: Secondary | ICD-10-CM | POA: Insufficient documentation

## 2014-10-12 DIAGNOSIS — Z8546 Personal history of malignant neoplasm of prostate: Secondary | ICD-10-CM | POA: Diagnosis not present

## 2014-10-12 DIAGNOSIS — M1990 Unspecified osteoarthritis, unspecified site: Secondary | ICD-10-CM | POA: Diagnosis present

## 2014-10-12 DIAGNOSIS — I959 Hypotension, unspecified: Secondary | ICD-10-CM

## 2014-10-12 DIAGNOSIS — E875 Hyperkalemia: Secondary | ICD-10-CM | POA: Diagnosis present

## 2014-10-12 DIAGNOSIS — J189 Pneumonia, unspecified organism: Secondary | ICD-10-CM

## 2014-10-12 DIAGNOSIS — R6521 Severe sepsis with septic shock: Secondary | ICD-10-CM | POA: Diagnosis present

## 2014-10-12 DIAGNOSIS — A419 Sepsis, unspecified organism: Principal | ICD-10-CM | POA: Diagnosis present

## 2014-10-12 DIAGNOSIS — Z515 Encounter for palliative care: Secondary | ICD-10-CM

## 2014-10-12 DIAGNOSIS — D500 Iron deficiency anemia secondary to blood loss (chronic): Secondary | ICD-10-CM | POA: Diagnosis present

## 2014-10-12 DIAGNOSIS — I340 Nonrheumatic mitral (valve) insufficiency: Secondary | ICD-10-CM | POA: Diagnosis not present

## 2014-10-12 DIAGNOSIS — Z681 Body mass index (BMI) 19 or less, adult: Secondary | ICD-10-CM

## 2014-10-12 DIAGNOSIS — E119 Type 2 diabetes mellitus without complications: Secondary | ICD-10-CM | POA: Diagnosis present

## 2014-10-12 DIAGNOSIS — R509 Fever, unspecified: Secondary | ICD-10-CM

## 2014-10-12 DIAGNOSIS — J9600 Acute respiratory failure, unspecified whether with hypoxia or hypercapnia: Secondary | ICD-10-CM | POA: Diagnosis present

## 2014-10-12 DIAGNOSIS — Z886 Allergy status to analgesic agent status: Secondary | ICD-10-CM | POA: Diagnosis not present

## 2014-10-12 DIAGNOSIS — I471 Supraventricular tachycardia: Secondary | ICD-10-CM | POA: Diagnosis present

## 2014-10-12 DIAGNOSIS — I10 Essential (primary) hypertension: Secondary | ICD-10-CM | POA: Diagnosis present

## 2014-10-12 DIAGNOSIS — T380X5A Adverse effect of glucocorticoids and synthetic analogues, initial encounter: Secondary | ICD-10-CM | POA: Diagnosis present

## 2014-10-12 DIAGNOSIS — Z66 Do not resuscitate: Secondary | ICD-10-CM | POA: Diagnosis present

## 2014-10-12 DIAGNOSIS — R0682 Tachypnea, not elsewhere classified: Secondary | ICD-10-CM

## 2014-10-12 DIAGNOSIS — Z87891 Personal history of nicotine dependence: Secondary | ICD-10-CM | POA: Diagnosis not present

## 2014-10-12 DIAGNOSIS — R000 Tachycardia, unspecified: Secondary | ICD-10-CM

## 2014-10-12 DIAGNOSIS — I248 Other forms of acute ischemic heart disease: Secondary | ICD-10-CM | POA: Diagnosis present

## 2014-10-12 DIAGNOSIS — N390 Urinary tract infection, site not specified: Secondary | ICD-10-CM | POA: Diagnosis present

## 2014-10-12 LAB — TYPE AND SCREEN
ABO/RH(D): A POS
Antibody Screen: NEGATIVE

## 2014-10-12 LAB — COMPREHENSIVE METABOLIC PANEL
ALK PHOS: 67 U/L (ref 39–117)
ALT: 10 U/L (ref 0–53)
ALT: 50 U/L (ref 0–53)
ANION GAP: 15 (ref 5–15)
ANION GAP: 9 (ref 5–15)
AST: 27 U/L (ref 0–37)
AST: 128 U/L — ABNORMAL HIGH (ref 0–37)
Albumin: 3.6 g/dL (ref 3.5–5.2)
Albumin: 2.9 g/dL — ABNORMAL LOW (ref 3.5–5.2)
Alkaline Phosphatase: 68 U/L (ref 39–117)
BILIRUBIN TOTAL: 0.7 mg/dL (ref 0.3–1.2)
BUN: 37 mg/dL — AB (ref 6–23)
BUN: 35 mg/dL — AB (ref 6–23)
CALCIUM: 8.2 mg/dL — AB (ref 8.4–10.5)
CO2: 23 mmol/L (ref 19–32)
CO2: 24 mmol/L (ref 19–32)
Calcium: 9.3 mg/dL (ref 8.4–10.5)
Chloride: 97 mmol/L (ref 96–112)
Chloride: 103 mmol/L (ref 96–112)
Creatinine, Ser: 1.02 mg/dL (ref 0.50–1.35)
Creatinine, Ser: 0.96 mg/dL (ref 0.50–1.35)
GFR calc Af Amer: 69 mL/min — ABNORMAL LOW (ref >90)
GFR calc non Af Amer: 60 mL/min — ABNORMAL LOW (ref >90)
GFR calc non Af Amer: 68 mL/min — ABNORMAL LOW (ref >90)
GFR, EST AFRICAN AMERICAN: 79 mL/min — AB (ref >90)
GLUCOSE: 251 mg/dL — AB (ref 70–99)
GLUCOSE: 261 mg/dL — AB (ref 70–99)
POTASSIUM: 4.9 mmol/L (ref 3.5–5.1)
POTASSIUM: 4.8 mmol/L (ref 3.5–5.1)
Sodium: 135 mmol/L (ref 135–145)
Sodium: 136 mmol/L (ref 135–145)
TOTAL PROTEIN: 6.8 g/dL (ref 6.0–8.3)
Total Bilirubin: 0.7 mg/dL (ref 0.3–1.2)
Total Protein: 8.2 g/dL (ref 6.0–8.3)

## 2014-10-12 LAB — CBC WITH DIFFERENTIAL/PLATELET
BASOS PCT: 0 % (ref 0–1)
Basophils Absolute: 0 10*3/uL (ref 0.0–0.1)
Eosinophils Absolute: 0 10*3/uL (ref 0.0–0.7)
Eosinophils Relative: 0 % (ref 0–5)
HCT: 36.4 % — ABNORMAL LOW (ref 39.0–52.0)
HEMOGLOBIN: 11.2 g/dL — AB (ref 13.0–17.0)
Lymphocytes Relative: 4 % — ABNORMAL LOW (ref 12–46)
Lymphs Abs: 1.3 10*3/uL (ref 0.7–4.0)
MCH: 26.1 pg (ref 26.0–34.0)
MCHC: 30.8 g/dL (ref 30.0–36.0)
MCV: 84.8 fL (ref 78.0–100.0)
MONOS PCT: 3 % (ref 3–12)
Monocytes Absolute: 0.9 10*3/uL (ref 0.1–1.0)
NEUTROS PCT: 93 % — AB (ref 43–77)
Neutro Abs: 29.1 10*3/uL — ABNORMAL HIGH (ref 1.7–7.7)
PLATELETS: 519 10*3/uL — AB (ref 150–400)
RBC: 4.29 MIL/uL (ref 4.22–5.81)
RDW: 16.3 % — ABNORMAL HIGH (ref 11.5–15.5)
WBC: 31.3 10*3/uL — ABNORMAL HIGH (ref 4.0–10.5)

## 2014-10-12 LAB — URINE MICROSCOPIC-ADD ON

## 2014-10-12 LAB — URINALYSIS, ROUTINE W REFLEX MICROSCOPIC
Bilirubin Urine: NEGATIVE
GLUCOSE, UA: NEGATIVE mg/dL
KETONES UR: NEGATIVE mg/dL
Nitrite: NEGATIVE
PROTEIN: 30 mg/dL — AB
Specific Gravity, Urine: 1.019 (ref 1.005–1.030)
Urobilinogen, UA: 1 mg/dL (ref 0.0–1.0)
pH: 6 (ref 5.0–8.0)

## 2014-10-12 LAB — CBC
HCT: 33.7 % — ABNORMAL LOW (ref 39.0–52.0)
Hemoglobin: 10.1 g/dL — ABNORMAL LOW (ref 13.0–17.0)
MCH: 25.6 pg — ABNORMAL LOW (ref 26.0–34.0)
MCHC: 30 g/dL (ref 30.0–36.0)
MCV: 85.5 fL (ref 78.0–100.0)
Platelets: 435 10*3/uL — ABNORMAL HIGH (ref 150–400)
RBC: 3.94 MIL/uL — ABNORMAL LOW (ref 4.22–5.81)
RDW: 16.3 % — AB (ref 11.5–15.5)
WBC: 29.4 10*3/uL — ABNORMAL HIGH (ref 4.0–10.5)

## 2014-10-12 LAB — APTT: aPTT: 34 seconds (ref 24–37)

## 2014-10-12 LAB — I-STAT CG4 LACTIC ACID, ED: LACTIC ACID, VENOUS: 3.32 mmol/L — AB (ref 0.5–2.0)

## 2014-10-12 LAB — PROTIME-INR
INR: 1.26 (ref 0.00–1.49)
PROTHROMBIN TIME: 15.9 s — AB (ref 11.6–15.2)

## 2014-10-12 LAB — BRAIN NATRIURETIC PEPTIDE: B Natriuretic Peptide: 778.4 pg/mL — ABNORMAL HIGH (ref 0.0–100.0)

## 2014-10-12 LAB — LACTIC ACID, PLASMA: Lactic Acid, Venous: 3.2 mmol/L (ref 0.5–2.0)

## 2014-10-12 LAB — TROPONIN I: TROPONIN I: 0.52 ng/mL — AB (ref <0.031)

## 2014-10-12 LAB — MAGNESIUM: MAGNESIUM: 1.7 mg/dL (ref 1.5–2.5)

## 2014-10-12 LAB — OCCULT BLOOD X 1 CARD TO LAB, STOOL: Fecal Occult Bld: NEGATIVE

## 2014-10-12 LAB — GLUCOSE, CAPILLARY: Glucose-Capillary: 268 mg/dL — ABNORMAL HIGH (ref 70–99)

## 2014-10-12 LAB — ABO/RH: ABO/RH(D): A POS

## 2014-10-12 MED ORDER — ACETAMINOPHEN 325 MG PO TABS
650.0000 mg | ORAL_TABLET | Freq: Four times a day (QID) | ORAL | Status: DC | PRN
  Administered 2014-10-12: 650 mg via ORAL
  Filled 2014-10-12: qty 2

## 2014-10-12 MED ORDER — PANTOPRAZOLE SODIUM 40 MG IV SOLR
40.0000 mg | Freq: Two times a day (BID) | INTRAVENOUS | Status: DC
  Administered 2014-10-12 – 2014-10-14 (×4): 40 mg via INTRAVENOUS
  Filled 2014-10-12 (×4): qty 40

## 2014-10-12 MED ORDER — HYDROCORTISONE NA SUCCINATE PF 100 MG IJ SOLR
100.0000 mg | Freq: Three times a day (TID) | INTRAMUSCULAR | Status: DC
  Administered 2014-10-12 – 2014-10-14 (×6): 100 mg via INTRAVENOUS
  Filled 2014-10-12 (×6): qty 2

## 2014-10-12 MED ORDER — CEFTRIAXONE SODIUM 1 G IJ SOLR
1.0000 g | INTRAMUSCULAR | Status: DC
  Administered 2014-10-13: 1 g via INTRAVENOUS
  Filled 2014-10-12: qty 10

## 2014-10-12 MED ORDER — ACETAMINOPHEN 325 MG PO TABS: 650.0000 mg | ORAL_TABLET | Freq: Four times a day (QID) | ORAL | Status: DC | PRN

## 2014-10-12 MED ORDER — INSULIN ASPART 100 UNIT/ML ~~LOC~~ SOLN
0.0000 [IU] | Freq: Three times a day (TID) | SUBCUTANEOUS | Status: DC
  Administered 2014-10-13: 2 [IU] via SUBCUTANEOUS
  Administered 2014-10-13: 7 [IU] via SUBCUTANEOUS
  Administered 2014-10-13 – 2014-10-14 (×2): 5 [IU] via SUBCUTANEOUS
  Administered 2014-10-14: 3 [IU] via SUBCUTANEOUS

## 2014-10-12 MED ORDER — POLYSACCHARIDE IRON COMPLEX 150 MG PO CAPS
150.0000 mg | ORAL_CAPSULE | Freq: Three times a day (TID) | ORAL | Status: DC
  Administered 2014-10-12 – 2014-10-14 (×5): 150 mg via ORAL
  Filled 2014-10-12 (×7): qty 1

## 2014-10-12 MED ORDER — SODIUM CHLORIDE 0.9 % IV BOLUS (SEPSIS)
1000.0000 mL | Freq: Once | INTRAVENOUS | Status: AC
  Administered 2014-10-12: 1000 mL via INTRAVENOUS

## 2014-10-12 MED ORDER — DEXTROSE 5 % IV SOLN
1.0000 g | Freq: Once | INTRAVENOUS | Status: AC
  Administered 2014-10-12: 1 g via INTRAVENOUS
  Filled 2014-10-12: qty 10

## 2014-10-12 MED ORDER — MIRTAZAPINE 15 MG PO TABS
15.0000 mg | ORAL_TABLET | Freq: Every day | ORAL | Status: DC
  Administered 2014-10-12 – 2014-10-13 (×2): 15 mg via ORAL
  Filled 2014-10-12 (×2): qty 1

## 2014-10-12 MED ORDER — ACETAMINOPHEN 500 MG PO TABS: 1000.0000 mg | ORAL_TABLET | Freq: Once | ORAL | Status: DC

## 2014-10-12 MED ORDER — SODIUM CHLORIDE 0.9 % IV SOLN
INTRAVENOUS | Status: DC
  Administered 2014-10-12 – 2014-10-14 (×4): via INTRAVENOUS

## 2014-10-12 MED ORDER — ADULT MULTIVITAMIN W/MINERALS CH
1.0000 | ORAL_TABLET | Freq: Every day | ORAL | Status: DC
  Administered 2014-10-13 – 2014-10-14 (×2): 1 via ORAL
  Filled 2014-10-12 (×4): qty 1

## 2014-10-12 MED ORDER — DEXTROSE 5 % IV SOLN
500.0000 mg | INTRAVENOUS | Status: DC
  Administered 2014-10-13: 500 mg via INTRAVENOUS
  Filled 2014-10-12: qty 500

## 2014-10-12 MED ORDER — DEXTROSE 5 % IV SOLN
500.0000 mg | Freq: Once | INTRAVENOUS | Status: AC
  Administered 2014-10-12: 500 mg via INTRAVENOUS
  Filled 2014-10-12: qty 500

## 2014-10-12 MED ORDER — PROMETHAZINE HCL 12.5 MG PO TABS
6.2500 mg | ORAL_TABLET | Freq: Four times a day (QID) | ORAL | Status: DC | PRN
  Filled 2014-10-12: qty 1

## 2014-10-12 MED ORDER — ACETAMINOPHEN 650 MG RE SUPP: 650.0000 mg | Freq: Four times a day (QID) | RECTAL | Status: DC | PRN

## 2014-10-12 NOTE — ED Notes (Signed)
Called ICU x2 no anwser to give report.

## 2014-10-12 NOTE — ED Notes (Signed)
Condom catheter applied.

## 2014-10-12 NOTE — ED Notes (Signed)
Bed: WA04 Expected date:  Expected time:  Means of arrival:  Comments: EMS vomiting 

## 2014-10-12 NOTE — Progress Notes (Addendum)
EDCM spoke to patient's caregiver at bedside Melvia Heaps.  Patient transported to xray.  Larey Seat reports patient receives 24 hour care with private [facility] nursing [facility].  Patient lives at home alone with 24 hour care.  Larey Seat reports patient does not have any family near by.  Patient has a "niece"  Fraser Din who lives in [place] who is the patient's POA.  Larey Seat reports patient's wife and daughter passed away.  Patient has a walker, cane and bedside commode at home.  Patient needs assistance with ADL's.  Patient noted to be wearing oxygen in the ED, patient does not wear oxygen at home.  Larey Seat reports patient started vomiting around 11am.  Larey Seat reports patient is no longer active with hospice services.  Per chart review, patient was seen by [facility] in the past.  [facility] placed call to [place], to check patient status and spoke to [place] at 1840pm who will place call to on call RN.  Awaiting call back.  Larey Seat reports patient is usually able to ambulate using his walker.  Patient unable to do that today.  No further EDCM needs at this time.  0325/2016 A. Cordney Barstow RNCM 1859pm [facility] received phone call from Stephania Fragmin of [facility] who reports patient was discharged from hospice services on Jul 18, 2014.

## 2014-10-12 NOTE — ED Notes (Signed)
Spoke with POA Pat informed of status and the plan for care. She states that if needed she came come up, she can also fax over the POA papers if needed. Number on chart and avialable if needed to talk with doctor.

## 2014-10-12 NOTE — Progress Notes (Signed)
CRITICAL VALUE ALERT  Critical value received:  Troponin 0.52, lactate 3.2 Date of notification:  Time of notification:  2325  Critical value read back:Yes.    Nurse who received alert:  c Yancey Pedley  MD notified (1st page): carter, n  Time of first page:  2337 MD notified (2nd page):  Time of second page:  Responding MD:  Eulas Post, n Time MD responded: [phone]

## 2014-10-12 NOTE — ED Notes (Addendum)
Jason Lawson, "like a daughter" to patient, is POA, lives in [place]. Jason Lawson would like to be notified of any information, changes, updates. POA would also like [place] to call MD [place].  Phone number is: [phone]

## 2014-10-12 NOTE — H&P (Signed)
[facility] History and Physical  Ramonte Mena YDX:[id number] DOB: 07/03/1916 DOA: 10/14/2014  PCP: Sheela Stack, MD   Chief Complaint: Nausea, vomiting, weakness  HPI: Jason Lawson is a 81 y.o. gentleman with a history of HTN, DM, chronic anemia, and prostate cancer who was brought to the ED accompanied by his caregivers for evaluation of nausea, vomiting, and weakness for approximately 24 hours prior to presentation.  He denies chest pain or abdominal pain.  The patient reported subacute, intermittent SOB in the ED, but he denies SOB currently.  He has had intermittent cough, productive of clear phlegm.  Appetite has been stable (breakfast is typically his biggest meal).  Caregivers report a several week history of melena (which they do not think is simply related to his iron supplement).  No lightheadedness.  The patient has not passed out.  The patient has several stairs in his home, and he typically ambulates with a walker and at least one-person assist at baseline.  His activity has been limited due to weakness.  No falls.  At least two of his caregivers have had recent upper respiratory symptoms.  Upon presentation to the ED, he was found to be febrile and tachycardic, with an elevated WBC count to 31,000.  Chest xray shows bibasilar infiltrates.  U/A also shows large leukocytes, TNTC WBCs, and many bacteria.  Blood pressures have been 72'C to 94'B systolic, with MAPs holding between 60 and 65.  He is being admitted for management of septic shock secondary to CAP and UTI.  Of note, the patient was discharged from hospice care in December.  He is still able to pay his own bills.  He goes out to dinner on occasion.  His wife and daughter are deceases, and his next of kin/POA is in [place].  He has a DNR/DNI status.  Review of Systems: 10 systems reviewed and negative except as stated in HPI.     Past Medical History  Diagnosis Date  . Diabetes   . Prostate cancer   . Arthritis   .  Anemia   . Dysrhythmia     "irregularities" in rhythm  . Osler hemorrhagic telangiectasia syndrome     Hx: of  . Hypertension    Past Surgical History  Procedure Laterality Date  . Cataract extraction w/ intraocular lens  implant, bilateral      Hx: of  . Colonoscopy      HX: of   Social History:  History   Social History Narrative   No tobacco use.  Rare EtOH.  Has 24 hour caregivers.  Discharged from hospice in December.  Widower.  Next of kin and POA is Fraser Din, who lives in [place].  Ambulates with walker and one person assist.    Allergies  Allergen Reactions  . Aspirin Other (See Comments)    Causes GI bleeding    Family History  Problem Relation Age of Onset  . Allergies Daughter   . Heart disease Daughter   . Breast cancer Mother    Prior to Admission medications   Medication Sig Start Date End Date Taking? Authorizing Provider  acetaminophen (TYLENOL) 650 MG CR tablet Take 650 mg by mouth every 8 (eight) hours as needed for pain (pain).   Yes Historical Provider, MD  b complex vitamins tablet Take 1 tablet by mouth daily.   Yes Historical Provider, MD  calcium citrate-vitamin D (CITRACAL+D) 315-200 MG-UNIT per tablet Take 1 tablet by mouth 2 (two) times daily.   Yes Historical Provider, MD  furosemide (LASIX) 40 MG tablet Take 40 mg by mouth every other day.  05/01/13  Yes Historical Provider, MD  iron polysaccharides (NIFEREX) 150 MG capsule Take 150 mg by mouth 3 (three) times daily.    Yes Historical Provider, MD  metFORMIN (GLUCOPHAGE) 500 MG tablet Take 500 mg by mouth 2 (two) times daily with a meal.  04/03/13  Yes Historical Provider, MD  mirtazapine (REMERON) 15 MG tablet Take 15 mg by mouth at bedtime.  01/02/14  Yes Historical Provider, MD  Multiple Vitamins-Minerals (CENTRUM SILVER PO) Take 1 tablet by mouth daily.   Yes Historical Provider, MD  ramipril (ALTACE) 2.5 MG capsule Take 2.5 mg by mouth daily.  05/07/13  Yes Historical Provider, MD   Physical  Exam: Filed Vitals:    1837 10/08/2014 1845 09/30/2014 1933 10/17/2014 2117  BP: 95/67 96/59 91/56    Pulse: 141 140 105 132  Temp:   99.8 F (37.7 C) 97.9 F (36.6 C)  TempSrc:    Axillary  Resp: 38  20 39  Weight:      SpO2: 98% 97% 97% 99%    General:  Awake and alert.  Oriented to person, place, and situation.  Frail and chronically ill appearing. Eyes: PERRL bilaterally, conjunctiva are pink.  EOMI. ENT: Mucous membranes are dry. Neck: Very thin.  No carotid bruit.  Cardiovascular: Tachycardic but regular.  No lower extremity edema. Respiratory: No wheeze or ronchi listening anteriorly. Abdomen: Very thin.  Soft/NT/ND.  Bowel sounds are present.  No guarding. Skin: Pale and dry; he is not clammy. Musculoskeletal: Moves all four extremities spontaneously. Psychiatric: Normal affect. Neurologic: No focal deficits.  Labs on Admission:  Basic Metabolic Panel:  Recent Labs Lab 10/05/2014 1729 10/13/2014 2227  NA 135 136  K 4.9 4.8  CL 97 103  CO2 23 24  GLUCOSE 251* 261*  BUN 37* 35*  CREATININE 1.02 0.96  CALCIUM 9.3 8.2*  MG  --  1.7   Liver Function Tests:  Recent Labs Lab  1729  AST 27  ALT 10  ALKPHOS 67  BILITOT 0.7  PROT 8.2  ALBUMIN 3.6   CBC:  Recent Labs Lab 09/27/2014 1729  WBC 31.3*  NEUTROABS 29.1*  HGB 11.2*  HCT 36.4*  MCV 84.8  PLT 519*   Lactic acid level 3 on admit  Radiological Exams on Admission: Dg Chest 2 View  10/18/2014   CLINICAL DATA:  Nausea and vomiting.  History of prostate carcinoma  EXAM: CHEST  2 VIEW  COMPARISON:  January 24, 2014  FINDINGS: There is patchy infiltrate in both lower lobes with greater consolidation in the left base compared to the right base. There is a small effusion on the left. There is mild cardiomegaly with pulmonary vascularity within normal limits. There is atherosclerotic change in the aortic arch region. No adenopathy. No bone lesions.  IMPRESSION: Airspace consolidation in both lower  lobes, more severe on the left than on the right. Small left effusion. Mild cardiac prominence.   Electronically Signed   By: Lowella Grip III M.D.   On: 09/30/2014 18:36    EKG: Independently reviewed. Poor R wave progression in anterior leads.  Nonspecific T wave changes. No acute ST segment elevations.  Assessment/Plan Principal Problem:   Sepsis with hypotension Active Problems:   Sinus tachycardia   CAP (community acquired pneumonia)   Melena   Anemia due to chronic blood loss   1. Admit to stepdown unit 2. Sepsis with hypotension secondary to CAP  and UTI --He has received 3L of NS and is now on maintenance fluids.  Blood pressures remain marginal despite volume resuscitation.  Initiating stress dose steroids now.  PCCM following via telecommunication.  Critical care fellow was actually dispatched for central line placement per my request, but patient now refusing this procedure.  POA notified.  Low threshold for starting pressor support at this point.  Patient and POA have been advised of risks of proceeding with these types of medications through peripheral IVs.  Current plan is to request PICC in AM, if his clinical status does not decline overnight. --Rocephin and azithromycin for now; blood and urine cultures pending --Foley catheter placed secondary to urinary retention --Repeat lactic acid ordered, serial troponins. 3.  Sinus tachycardia --Stable; compensatory at this point.  Continue to monitor. 4.  Reported melena, chronic anemia --Stool guaiac pending --IV protonix 40mg  q12h for now --Follow hgb; no transfusion requirement at this point.  Type and screen added to orders.  No heparin products due to anemia, concern for occult GI bleed.  Patient has aspirin allergy as well. SCDs for DVT prophylaxis.  Code Status: DNR/DNI Family Communication: POA Truman Hayward in [place] [phone].  She has been advised that patient is critically ill with guarded prognosis.   Caregivers at bedside.. Disposition Plan: Several days  Time spent: 75 minutes  [employer] [facility]  09/23/2014, 10:05 PM

## 2014-10-12 NOTE — ED Notes (Signed)
EKG given to EDP,Gentry,MD., for review.

## 2014-10-12 NOTE — ED Provider Notes (Signed)
CSN: [id number]     Arrival date & time 09/24/2014  1657 History   First MD Initiated Contact with Patient 10/02/2014 1725     Chief Complaint  Patient presents with  . Emesis  . Weakness     (Consider location/radiation/quality/duration/timing/severity/associated sxs/prior Treatment) Patient is a 81 y.o. male presenting with general illness.  Illness Location:  Generalized Quality:  Malaise, weakness Severity:  Moderate Onset quality:  Gradual Duration:  1 day Timing:  Constant Progression:  Worsening Chronicity:  New Context:  At home with home health aides, unable to ambulate due to generalized weakness Relieved by:  Nothing Worsened by:  Nothing Associated symptoms: shortness of breath (2-3 months)   Associated symptoms: no cough and no fever     Past Medical History  Diagnosis Date  . Diabetes   . Prostate cancer   . Arthritis   . Anemia   . Dysrhythmia     "irregularities" in rhythm  . Osler hemorrhagic telangiectasia syndrome     Hx: of  . Hypertension    Past Surgical History  Procedure Laterality Date  . Cataract extraction w/ intraocular lens  implant, bilateral      Hx: of  . Colonoscopy      HX: of   Family History  Problem Relation Age of Onset  . Allergies Daughter   . Heart disease Daughter   . Breast cancer Mother    History  Substance Use Topics  . Smoking status: Former Smoker -- 0.50 packs/day for 30 years    Types: Cigarettes    Quit date: 05/12/1963  . Smokeless tobacco: Former Systems developer    Types: Chew     Comment: "Quit befor using cigarettes"  . Alcohol Use: Yes     Comment: very rare use    Review of Systems  Constitutional: Negative for fever.  Respiratory: Positive for shortness of breath (2-3 months). Negative for cough.   All other systems reviewed and are negative.     Allergies  Aspirin  Home Medications   Prior to Admission medications   Medication Sig Start Date End Date Taking? Authorizing Provider  acetaminophen  (TYLENOL) 650 MG CR tablet Take 650 mg by mouth every 8 (eight) hours as needed for pain (pain).   Yes Historical Provider, MD  b complex vitamins tablet Take 1 tablet by mouth daily.   Yes Historical Provider, MD  calcium citrate-vitamin D (CITRACAL+D) 315-200 MG-UNIT per tablet Take 1 tablet by mouth 2 (two) times daily.   Yes Historical Provider, MD  furosemide (LASIX) 40 MG tablet Take 40 mg by mouth every other day.  05/01/13  Yes Historical Provider, MD  iron polysaccharides (NIFEREX) 150 MG capsule Take 150 mg by mouth 3 (three) times daily.    Yes Historical Provider, MD  metFORMIN (GLUCOPHAGE) 500 MG tablet Take 500 mg by mouth 2 (two) times daily with a meal.  04/03/13  Yes Historical Provider, MD  mirtazapine (REMERON) 15 MG tablet Take 15 mg by mouth at bedtime.  01/02/14  Yes Historical Provider, MD  Multiple Vitamins-Minerals (CENTRUM SILVER PO) Take 1 tablet by mouth daily.   Yes Historical Provider, MD  ramipril (ALTACE) 2.5 MG capsule Take 2.5 mg by mouth daily.  05/07/13  Yes Historical Provider, MD   BP 106/67 mmHg  Pulse 94  Temp(Src) 97.6 F (36.4 C) (Oral)  Resp 40  Ht 5' 10.87" (1.8 m)  Wt 130 lb 4.7 oz (59.1 kg)  BMI 18.24 kg/m2  SpO2 97% Physical Exam  Constitutional: He is oriented to person, place, and time. He appears well-developed and well-nourished.  HENT:  Head: Normocephalic and atraumatic.  Eyes: Conjunctivae and EOM are normal.  Neck: Normal range of motion. Neck supple.  Cardiovascular: Regular rhythm and normal heart sounds.  Tachycardia present.   Pulmonary/Chest: Effort normal. No respiratory distress. He has rales in the right lower field and the left lower field.  Abdominal: He exhibits no distension. There is no tenderness. There is no rebound and no guarding.  Musculoskeletal: Normal range of motion.  Neurological: He is alert and oriented to person, place, and time.  Skin: Skin is warm and dry.  Vitals reviewed.   ED Course  Procedures  (including critical care time) Labs Review Labs Reviewed  CBC WITH DIFFERENTIAL/PLATELET - Abnormal; Notable for the following:    WBC 31.3 (*)    Hemoglobin 11.2 (*)    HCT 36.4 (*)    RDW 16.3 (*)    Platelets 519 (*)    Neutrophils Relative % 93 (*)    Lymphocytes Relative 4 (*)    Neutro Abs 29.1 (*)    All other components within normal limits  COMPREHENSIVE METABOLIC PANEL - Abnormal; Notable for the following:    Glucose, Bld 251 (*)    BUN 37 (*)    GFR calc non Af Amer 60 (*)    GFR calc Af Amer 69 (*)    All other components within normal limits  URINALYSIS, ROUTINE W REFLEX MICROSCOPIC - Abnormal; Notable for the following:    Color, Urine AMBER (*)    APPearance TURBID (*)    Hgb urine dipstick LARGE (*)    Protein, ur 30 (*)    Leukocytes, UA LARGE (*)    All other components within normal limits  URINE MICROSCOPIC-ADD ON - Abnormal; Notable for the following:    Bacteria, UA MANY (*)    All other components within normal limits  CBC - Abnormal; Notable for the following:    WBC 29.4 (*)    RBC 3.94 (*)    Hemoglobin 10.1 (*)    HCT 33.7 (*)    MCH 25.6 (*)    RDW 16.3 (*)    Platelets 435 (*)    All other components within normal limits  COMPREHENSIVE METABOLIC PANEL - Abnormal; Notable for the following:    Glucose, Bld 261 (*)    BUN 35 (*)    Calcium 8.2 (*)    Albumin 2.9 (*)    AST 128 (*)    GFR calc non Af Amer 68 (*)    GFR calc Af Amer 79 (*)    All other components within normal limits  LACTIC ACID, PLASMA - Abnormal; Notable for the following:    Lactic Acid, Venous 3.2 (*)    All other components within normal limits  LACTIC ACID, PLASMA - Abnormal; Notable for the following:    Lactic Acid, Venous 2.9 (*)    All other components within normal limits  TROPONIN I - Abnormal; Notable for the following:    Troponin I 0.52 (*)    All other components within normal limits  TROPONIN I - Abnormal; Notable for the following:    Troponin I  1.62 (*)    All other components within normal limits  TROPONIN I - Abnormal; Notable for the following:    Troponin I 2.68 (*)    All other components within normal limits  BRAIN NATRIURETIC PEPTIDE - Abnormal; Notable for the following:  B Natriuretic Peptide 778.4 (*)    All other components within normal limits  CBC - Abnormal; Notable for the following:    WBC 29.1 (*)    RBC 3.81 (*)    Hemoglobin 9.9 (*)    HCT 32.5 (*)    RDW 16.6 (*)    Platelets 437 (*)    All other components within normal limits  BASIC METABOLIC PANEL - Abnormal; Notable for the following:    Glucose, Bld 213 (*)    BUN 34 (*)    GFR calc non Af Amer 73 (*)    GFR calc Af Amer 85 (*)    All other components within normal limits  PROTIME-INR - Abnormal; Notable for the following:    Prothrombin Time 15.9 (*)    All other components within normal limits  GLUCOSE, CAPILLARY - Abnormal; Notable for the following:    Glucose-Capillary 268 (*)    All other components within normal limits  GLUCOSE, CAPILLARY - Abnormal; Notable for the following:    Glucose-Capillary 195 (*)    All other components within normal limits  GLUCOSE, CAPILLARY - Abnormal; Notable for the following:    Glucose-Capillary 292 (*)    All other components within normal limits  I-STAT CG4 LACTIC ACID, ED - Abnormal; Notable for the following:    Lactic Acid, Venous 3.32 (*)    All other components within normal limits  MRSA PCR SCREENING  CULTURE, BLOOD (ROUTINE X 2)  CULTURE, BLOOD (ROUTINE X 2)  URINE CULTURE  CORTISOL  MAGNESIUM  OCCULT BLOOD X 1 CARD TO LAB, STOOL  APTT  HEMOGLOBIN A1C  INFLUENZA PANEL BY PCR (TYPE A & B, H1N1)  LEGIONELLA ANTIGEN, URINE  STREP PNEUMONIAE URINARY ANTIGEN  TYPE AND SCREEN  ABO/RH    Imaging Review Dg Chest 2 View  10/01/2014   CLINICAL DATA:  Nausea and vomiting.  History of prostate carcinoma  EXAM: CHEST  2 VIEW  COMPARISON:  January 24, 2014  FINDINGS: There is patchy infiltrate  in both lower lobes with greater consolidation in the left base compared to the right base. There is a small effusion on the left. There is mild cardiomegaly with pulmonary vascularity within normal limits. There is atherosclerotic change in the aortic arch region. No adenopathy. No bone lesions.  IMPRESSION: Airspace consolidation in both lower lobes, more severe on the left than on the right. Small left effusion. Mild cardiac prominence.   Electronically Signed   By: Lowella Grip III M.D.   On: 09/18/2014 18:36     EKG Interpretation   Date/Time:  Friday October 12 2014 17:10:17 EDT Ventricular Rate:  144 PR Interval:    QRS Duration: 121 QT Interval:  313 QTC Calculation: 484 R Axis:   -83 Text Interpretation:  Wide-QRS tachycardia IVCD, consider atypical RBBB  SINCE LAST TRACING HEART RATE HAS INCREASED Confirmed by Debby Freiberg  [phone]) on 10/14/2014 5:27:02 PM     CRITICAL CARE Performed by: Debby Freiberg   Total critical care time: 76 min  Critical care time was exclusive of separately billable procedures and treating other patients.  Critical care was necessary to treat or prevent imminent or life-threatening deterioration.  Critical care was time spent personally by me on the following activities: development of treatment plan with patient and/or surrogate as well as nursing, discussions with consultants, evaluation of patient's response to treatment, examination of patient, obtaining history from patient or surrogate, ordering and performing treatments and interventions, ordering and review  of laboratory studies, ordering and review of radiographic studies, pulse oximetry and re-evaluation of patient's condition.  MDM   Final diagnoses:  Fever    81 y.o. male with pertinent PMH of DM, prior pulm effusion, DM presents with malaise, generalized weakness as above.  On arrival pt tachycardic to 150, concern for SVT.  He converted after vagal maneuvers, but within 5  minutes reverted to SVT.  Interval ECG without ischmic change, with NSR at rate of 107.  Physical exam otherwise as above.  Wu as above.    Likely PNA precipitating SVT.  No chest pain. Rocephin/azithro given for CAP.  Admitted in stable condition.      I have reviewed all laboratory and imaging studies if ordered as above  1. Fever   2. CAP 3. SVT    Debby Freiberg, MD 10/13/14 [phone]

## 2014-10-12 NOTE — ED Notes (Signed)
Attempted to in and out cath pt. Met with too much resistance, did not force catheter, RN aware

## 2014-10-12 NOTE — ED Notes (Signed)
Per EMS, Pt c/o N/V since this afternoon. Pt c/o weakness over the last couple of days. Pt from home and has home health aides. Pt normally ambulates up and down stairs with the assistance of his aides, but today was unable to. Pt A&Ox4. Pt denies pain.

## 2014-10-12 NOTE — Progress Notes (Signed)
ANTIBIOTIC CONSULT NOTE - INITIAL  Pharmacy Consult for Ceftriaxone/Azithromycin Indication: CAP  Allergies  Allergen Reactions  . Aspirin Other (See Comments)    Causes GI bleeding    Patient Measurements: Height: 5' 10.87" (180 cm) Weight: 140 lb (63.504 kg) IBW/kg (Calculated) : 74.99  Vital Signs: Temp: 97.9 F (36.6 C) (03/25 2117) Temp Source: Axillary (03/25 2117) BP: 91/56 mmHg (03/25 1933) Pulse Rate: 132 (03/25 2117) Intake/Output from previous day:   Intake/Output from this shift: Total I/O In: 120 [P.O.:120] Out: 300 [Urine:300]  Labs:  Recent Labs  09/30/2014 1729  WBC 31.3*  HGB 11.2*  PLT 519*  CREATININE 1.02   Estimated Creatinine Clearance: 38 mL/min (by C-G formula based on Cr of 1.02). No results for input(s): VANCOTROUGH, VANCOPEAK, VANCORANDOM, GENTTROUGH, GENTPEAK, GENTRANDOM, TOBRATROUGH, TOBRAPEAK, TOBRARND, AMIKACINPEAK, AMIKACINTROU, AMIKACIN in the last 72 hours.   Microbiology: No results found for this or any previous visit (from the past 720 hour(s)).  Medical History: Past Medical History  Diagnosis Date  . Diabetes   . Prostate cancer   . Arthritis   . Anemia   . Dysrhythmia     "irregularities" in rhythm  . Osler hemorrhagic telangiectasia syndrome     Hx: of  . Hypertension     Medications:  Scheduled:  . [START ON 10/13/2014] azithromycin  500 mg Intravenous Q24H  . [START ON 10/13/2014] cefTRIAXone (ROCEPHIN)  IV  1 g Intravenous Q24H  . hydrocortisone sod succinate (SOLU-CORTEF) inj  100 mg Intravenous Q8H  . [START ON 10/13/2014] insulin aspart  0-9 Units Subcutaneous TID WC  . iron polysaccharides  150 mg Oral TID  . mirtazapine  15 mg Oral QHS  . multivitamin with minerals  1 tablet Oral Daily  . pantoprazole (PROTONIX) IV  40 mg Intravenous Q12H  . sodium chloride  1,000 mL Intravenous Once   Infusions:  . sodium chloride     PRN: acetaminophen **OR** acetaminophen, promethazine   Assessment 89yoM from  home with 24-hr nursing, admitted for vomiting, weakness.  Pharmacy to dose ceftriaxone/azithromycin for CAP.  Received one dose of each in the ED at 20:00 on 3/25.  3/25 >> azithromycin >> 3/25 >> ceftriaxone >>    Tmax: 101.7 WBCs: 31.3 Renal: SCr wnl, CrCl 38 ml/min (mainly due to advanced age)  3/25 blood: 3/25 urine:    Goal of Therapy:   Eradication of infection  Appropriate antibiotic dosing for indication and renal function  Plan:   Continue azithromycin 500 mg IV q24h, to start tomorrow  Continue ceftriaxone 1g IV q24h, to start tomorrow  Pharmacy will follow clinical course peripherally   Reuel Boom, PharmD Pager: [phone] 10/04/2014, 10:28 PM

## 2014-10-13 LAB — TROPONIN I
TROPONIN I: 2.68 ng/mL — AB (ref <0.031)
Troponin I: 1.62 ng/mL (ref <0.031)

## 2014-10-13 LAB — CORTISOL: Cortisol, Plasma: 50.4 ug/dL

## 2014-10-13 LAB — CBC
HEMATOCRIT: 32.5 % — AB (ref 39.0–52.0)
Hemoglobin: 9.9 g/dL — ABNORMAL LOW (ref 13.0–17.0)
MCH: 26 pg (ref 26.0–34.0)
MCHC: 30.5 g/dL (ref 30.0–36.0)
MCV: 85.3 fL (ref 78.0–100.0)
Platelets: 437 10*3/uL — ABNORMAL HIGH (ref 150–400)
RBC: 3.81 MIL/uL — ABNORMAL LOW (ref 4.22–5.81)
RDW: 16.6 % — AB (ref 11.5–15.5)
WBC: 29.1 10*3/uL — ABNORMAL HIGH (ref 4.0–10.5)

## 2014-10-13 LAB — BASIC METABOLIC PANEL
ANION GAP: 10 (ref 5–15)
BUN: 34 mg/dL — ABNORMAL HIGH (ref 6–23)
CALCIUM: 8.7 mg/dL (ref 8.4–10.5)
CHLORIDE: 104 mmol/L (ref 96–112)
CO2: 24 mmol/L (ref 19–32)
CREATININE: 0.8 mg/dL (ref 0.50–1.35)
GFR calc Af Amer: 85 mL/min — ABNORMAL LOW (ref >90)
GFR, EST NON AFRICAN AMERICAN: 73 mL/min — AB (ref >90)
GLUCOSE: 213 mg/dL — AB (ref 70–99)
Potassium: 4.9 mmol/L (ref 3.5–5.1)
SODIUM: 138 mmol/L (ref 135–145)

## 2014-10-13 LAB — LACTIC ACID, PLASMA: LACTIC ACID, VENOUS: 2.9 mmol/L — AB (ref 0.5–2.0)

## 2014-10-13 LAB — GLUCOSE, CAPILLARY
GLUCOSE-CAPILLARY: 195 mg/dL — AB (ref 70–99)
Glucose-Capillary: 292 mg/dL — ABNORMAL HIGH (ref 70–99)
Glucose-Capillary: 322 mg/dL — ABNORMAL HIGH (ref 70–99)
Glucose-Capillary: 307 mg/dL — ABNORMAL HIGH (ref 70–99)

## 2014-10-13 LAB — STREP PNEUMONIAE URINARY ANTIGEN: Strep Pneumo Urinary Antigen: NEGATIVE

## 2014-10-13 LAB — INFLUENZA PANEL BY PCR (TYPE A & B)
H1N1 flu by pcr: NOT DETECTED
Influenza A By PCR: NEGATIVE
Influenza B By PCR: NEGATIVE

## 2014-10-13 LAB — MRSA PCR SCREENING: MRSA by PCR: NEGATIVE

## 2014-10-13 MED ORDER — ENSURE ENLIVE PO LIQD
237.0000 mL | Freq: Two times a day (BID) | ORAL | Status: DC
  Administered 2014-10-13: 237 mL via ORAL

## 2014-10-13 MED ORDER — CETYLPYRIDINIUM CHLORIDE 0.05 % MT LIQD
7.0000 mL | Freq: Two times a day (BID) | OROMUCOSAL | Status: DC
  Administered 2014-10-13 – 2014-10-15 (×4): 7 mL via OROMUCOSAL

## 2014-10-13 NOTE — Progress Notes (Signed)
Pt placed on BiPAP for increased WOB. Pt tolerating well.

## 2014-10-13 NOTE — Progress Notes (Signed)
CRITICAL VALUE ALERT  Critical value received:  Lactic acid 2.9  Date of notification:  10/13/2014  Time of notification:  4268  Critical value read back:Yes.    Nurse who received alert:  mk  MD notified (1st page):  na  Time of first page:  na  MD notified (2nd page):  Time of second page:  Responding MD:  na  Time MD responded:  Na - result improved from previously called result

## 2014-10-13 NOTE — Progress Notes (Addendum)
[facility]                                                                              Patient Demographics  Jason Lawson, is a 81 y.o. male, DOB - 1916-02-07, QQV:[id number]  Admit date - 09/30/2014   Admitting Physician Lily Kocher, MD  Outpatient Primary MD for the patient is Sheela Stack, MD  LOS - 1   Chief Complaint  Patient presents with  . Emesis  . Weakness       Brief HPI   Jason Lawson is a 81 y.o. gentleman with a history of HTN, DM, chronic anemia, and prostate cancer who was brought to the ED accompanied by his caregivers for evaluation of nausea, vomiting, and weakness for approximately 24 hours prior to presentation. He denied chest pain or abdominal pain. The patient reported subacute, intermittent SOB in the ED, but he denied SOB currently. He has had intermittent cough, productive of clear phlegm. Appetite has been stable (breakfast is typically his biggest meal). Caregivers reported several week history of melena (which they do not think is simply related to his iron supplement). No lightheadedness. The patient had not passed out. The patient has several stairs in his home, and he typically ambulates with a walker and at least one-person assist at baseline. His activity has been limited due to weakness. No falls. At least two of his caregivers have had recent upper respiratory symptoms. Upon presentation to the ED, he was found to be febrile and tachycardic, with an elevated WBC count to 31,000. Chest xray shows bibasilar infiltrates. U/A also showed large leukocytes, TNTC WBCs, and many bacteria. BP in ED was 33'I to 95'J systolic, with MAPs holding between 60 and 65. He was admitted for management of septic shock secondary to CAP and UTI. Of note, the patient was discharged from hospice care in December. He is still able to pay his own bills. He goes out to dinner on occasion. His wife and daughter are deceased, and his next of  kin/POA is in [place]. He has a DNR/DNI status.     Assessment & Plan    Principal Problem:   Sepsis with hypotension/septic shock secondary to community-acquired pneumonia and UTI - Patient received 3 L IV fluid bolus, placed on septic shock protocol, also on stress dose steroids, currently on IV fluid hydration, BP currently stable and holding. - Lactic acid 3.32 at the time of admission, improving to 2.9 - Continue IV Rocephin and Zithromax - Follow blood cultures, urine culture and sensitivity - added urine legionella antigen, urine strep antigen, influenza panel  Active Problems: Elevated troponin, sinus tachycardia - Likely due to severe sepsis, demand ischemia, no chest pain or shortness of breath at the time of examination - Continue serial troponins, follow 2-D echocardiogram - Not a candidate for any invasive procedures with sepsis and hemodynamic instability, patient is allergic to aspirin - Addendum 1204 third troponin elevated at 2.68, patient is not having any chest pain or shortness of breath. Will obtain EKG however would not change any management. Discussed in detail with on-call cardiology, Dr. Bronson Ing recommended no heparin drip, patient is asymptomatic, no  aspirin due to his allergy.   Reported melena, chronic anemia - Hemoglobin is stable, fecal occult blood negative   Diabetes mellitus  - Place on sliding scale insulin   Code Status: DO NOT RESUSCITATE   Family Communication: Discussed in detail with the patient, all imaging results, lab results explained to the patient    Disposition Plan: Remain in stepdown   Time Spent in minutes  30 minutes  Procedures  chest x-ray   Consults   none  DVT Prophylaxis   SCD's  Medications  Scheduled Meds: . antiseptic oral rinse  7 mL Mouth Rinse BID  . azithromycin  500 mg Intravenous Q24H  . cefTRIAXone (ROCEPHIN)  IV  1 g Intravenous Q24H  . feeding supplement (ENSURE ENLIVE)  237 mL Oral BID BM  .  hydrocortisone sod succinate (SOLU-CORTEF) inj  100 mg Intravenous Q8H  . insulin aspart  0-9 Units Subcutaneous TID WC  . iron polysaccharides  150 mg Oral TID  . mirtazapine  15 mg Oral QHS  . multivitamin with minerals  1 tablet Oral Daily  . pantoprazole (PROTONIX) IV  40 mg Intravenous Q12H   Continuous Infusions: . sodium chloride 100 mL/hr at 10/13/14 0938   PRN Meds:.acetaminophen **OR** acetaminophen, promethazine   Antibiotics   Anti-infectives    Start     Dose/Rate Route Frequency Ordered Stop   10/13/14 2000  cefTRIAXone (ROCEPHIN) 1 g in dextrose 5 % 50 mL IVPB     1 g 100 mL/hr over 30 Minutes Intravenous Every 24 hours 09/29/2014 2218     10/13/14 2000  azithromycin (ZITHROMAX) 500 mg in dextrose 5 % 250 mL IVPB     500 mg 250 mL/hr over 60 Minutes Intravenous Every 24 hours  2218     10/11/2014 1915  cefTRIAXone (ROCEPHIN) 1 g in dextrose 5 % 50 mL IVPB     1 g 100 mL/hr over 30 Minutes Intravenous  Once 10/18/2014 1914 10/04/2014 2025   10/04/2014 1915  azithromycin (ZITHROMAX) 500 mg in dextrose 5 % 250 mL IVPB     500 mg 250 mL/hr over 60 Minutes Intravenous  Once 09/20/2014 1914 10/13/2014 2125        Subjective:   [name] was seen and examined today.  Significant improvement from last night, patient is now alert awake and oriented, denies any specific complaints, feels a lot better from yesterday.private caregiver at the bedside.  Objective:   Blood pressure 108/76, pulse 93, temperature 97.5 F (36.4 C), temperature source Oral, resp. rate 35, height 5' 10.87" (1.8 m), weight 59.1 kg (130 lb 4.7 oz), SpO2 100 %.  Wt Readings from Last 3 Encounters:  10/03/2014 59.1 kg (130 lb 4.7 oz)  01/24/14 68.04 kg (150 lb)  01/05/14 68.04 kg (150 lb)     Intake/Output Summary (Last 24 hours) at 10/13/14 1113 Last data filed at 10/13/14 0800  Gross per 24 hour  Intake 4171.67 ml  Output    900 ml  Net 3271.67 ml    Exam  General: Alert and oriented x  3, NAD  HEENT:  PERRLA, EOMI, Anicteic Sclera, mucous membranes moist.   Neck: Supple, no JVD, no masses  CVS: S1 S2 auscultated, no rubs, murmurs or gallops. Regular rate and rhythm.  Respiratory: Clear to auscultation bilaterally, no wheezing, rales or rhonchi  Abdomen: Soft, nontender, nondistended, + bowel sounds  Ext: no cyanosis clubbing or edema  Neuro: AAOx3, Cr N's II- XII. Strength 5/5 upper and lower extremities  bilaterally  Skin: No rashes  Psych: Normal affect and demeanor, alert and oriented x3    Data Review   Micro Results Recent Results (from the past 240 hour(s))  MRSA PCR Screening     Status: None   Collection Time: 09/27/2014  9:52 PM  Result Value Ref Range Status   MRSA by PCR NEGATIVE NEGATIVE Final    Comment:        The GeneXpert MRSA Assay (FDA approved for NASAL specimens only), is one component of a comprehensive MRSA colonization surveillance program. It is not intended to diagnose MRSA infection nor to guide or monitor treatment for MRSA infections.     Radiology Reports Dg Chest 2 View  10/04/2014   CLINICAL DATA:  Nausea and vomiting.  History of prostate carcinoma  EXAM: CHEST  2 VIEW  COMPARISON:  January 24, 2014  FINDINGS: There is patchy infiltrate in both lower lobes with greater consolidation in the left base compared to the right base. There is a small effusion on the left. There is mild cardiomegaly with pulmonary vascularity within normal limits. There is atherosclerotic change in the aortic arch region. No adenopathy. No bone lesions.  IMPRESSION: Airspace consolidation in both lower lobes, more severe on the left than on the right. Small left effusion. Mild cardiac prominence.   Electronically Signed   By: Lowella Grip III M.D.   On: 10/14/2014 18:36    CBC  Recent Labs Lab 09/19/2014 1729 09/22/2014 2227 10/13/14 0345  WBC 31.3* 29.4* 29.1*  HGB 11.2* 10.1* 9.9*  HCT 36.4* 33.7* 32.5*  PLT 519* 435* 437*  MCV 84.8 85.5  85.3  MCH 26.1 25.6* 26.0  MCHC 30.8 30.0 30.5  RDW 16.3* 16.3* 16.6*  LYMPHSABS 1.3  --   --   MONOABS 0.9  --   --   EOSABS 0.0  --   --   BASOSABS 0.0  --   --     Chemistries   Recent Labs Lab 10/09/2014 1729 09/27/2014 2227 10/13/14 0345  NA 135 136 138  K 4.9 4.8 4.9  CL 97 103 104  CO2 23 24 24   GLUCOSE 251* 261* 213*  BUN 37* 35* 34*  CREATININE 1.02 0.96 0.80  CALCIUM 9.3 8.2* 8.7  MG  --  1.7  --   AST 27 128*  --   ALT 10 50  --   ALKPHOS 67 68  --   BILITOT 0.7 0.7  --    ------------------------------------------------------------------------------------------------------------------ estimated creatinine clearance is 45.1 mL/min (by C-G formula based on Cr of 0.8). ------------------------------------------------------------------------------------------------------------------ No results for input(s): HGBA1C in the last 72 hours. ------------------------------------------------------------------------------------------------------------------ No results for input(s): CHOL, HDL, LDLCALC, TRIG, CHOLHDL, LDLDIRECT in the last 72 hours. ------------------------------------------------------------------------------------------------------------------ No results for input(s): TSH, T4TOTAL, T3FREE, THYROIDAB in the last 72 hours.  Invalid input(s): FREET3 ------------------------------------------------------------------------------------------------------------------ No results for input(s): VITAMINB12, FOLATE, FERRITIN, TIBC, IRON, RETICCTPCT in the last 72 hours.  Coagulation profile  Recent Labs Lab 10/10/2014 2227  INR 1.26    No results for input(s): DDIMER in the last 72 hours.  Cardiac Enzymes  Recent Labs Lab  2227 10/13/14 0345  TROPONINI 0.52* 1.62*   ------------------------------------------------------------------------------------------------------------------ Invalid input(s): POCBNP   Recent Labs  09/27/2014 2210  GLUCAP  268*     RAI,RIPUDEEP M.D. [facility] 10/13/2014, 11:13 AM  Pager: [phone]   Between 7am to 7pm - call Pager - [phone]  After 7pm go to [email or web address] - password [email or web address]  Call night coverage person covering  after 7pm

## 2014-10-13 NOTE — Progress Notes (Signed)
Pt O2 sats 89% on RA; applied [place] at 3L. O2 sats improved to 95-96%, but respiratory rate sustained in 40s. Notified hospitalist, received orders for ABG and BIPAP. Will continue to monitor.

## 2014-10-13 NOTE — Progress Notes (Signed)
Patient reassessed around 1:30AM and again just before 3AM.  Hemodynamically improved.  HR 80's-90's, sinus.  BP 482'L systolic with MAPs 07E-67J.  RR upper 20's to low 30's but O2 sats 99% on 2L [place].  Mildly concentrated yellow urine in foley catheter bag.  Patient sleeping peacefully.    Marginal elevation of troponin noted.  Will need to follow trend.  History of aspirin allergy and concern for occult GI bleed, so I would not anticoagulate at this point.  No clinical history to suggest acute coronary syndrome.  With elevated BNP, may need to consider echo in the AM.  PCCM assistance greatly appreciated.  Consider PICC in AM.  Update given to POA Pat at number listed in H/P.

## 2014-10-13 NOTE — Progress Notes (Signed)
Pharmacy: Re- ceftriaxone and azithromycin  Patient's a 80 y.o M on ceftriaxone and azithromycin day #1 for CAP. Tmax 101.7, wbc down 29.1, scr 0.80.  Plan: - continue azithromycin 500mg  IV q24h - continue ceftriaxone 1gm IV q24h - pharmacy will sign off as no renal adjustment is needed for these two antibiotics - re-consult [place] if need further assistance  Thank you for consulting pharmacy to participate in this patient's care.  Dia Sitter, PharmD, BCPS

## 2014-10-14 ENCOUNTER — Inpatient Hospital Stay (HOSPITAL_COMMUNITY): Payer: Medicare Other

## 2014-10-14 DIAGNOSIS — I340 Nonrheumatic mitral (valve) insufficiency: Secondary | ICD-10-CM

## 2014-10-14 LAB — CBC
HEMATOCRIT: 33.2 % — AB (ref 39.0–52.0)
Hemoglobin: 10.4 g/dL — ABNORMAL LOW (ref 13.0–17.0)
MCH: 26.1 pg (ref 26.0–34.0)
MCHC: 31.3 g/dL (ref 30.0–36.0)
MCV: 83.2 fL (ref 78.0–100.0)
PLATELETS: 421 10*3/uL — AB (ref 150–400)
RBC: 3.99 MIL/uL — AB (ref 4.22–5.81)
RDW: 16.5 % — ABNORMAL HIGH (ref 11.5–15.5)
WBC: 28.9 10*3/uL — ABNORMAL HIGH (ref 4.0–10.5)

## 2014-10-14 LAB — URINE CULTURE: Colony Count: >=100000

## 2014-10-14 LAB — BASIC METABOLIC PANEL
Anion gap: 11 (ref 5–15)
BUN: 44 mg/dL — ABNORMAL HIGH (ref 6–23)
CO2: 20 mmol/L (ref 19–32)
CREATININE: 0.88 mg/dL (ref 0.50–1.35)
Calcium: 8.9 mg/dL (ref 8.4–10.5)
Chloride: 106 mmol/L (ref 96–112)
GFR calc Af Amer: 81 mL/min — ABNORMAL LOW (ref >90)
GFR, EST NON AFRICAN AMERICAN: 70 mL/min — AB (ref >90)
GLUCOSE: 270 mg/dL — AB (ref 70–99)
Potassium: 5.5 mmol/L — ABNORMAL HIGH (ref 3.5–5.1)
SODIUM: 137 mmol/L (ref 135–145)

## 2014-10-14 LAB — BLOOD GAS, ARTERIAL
ACID-BASE DEFICIT: 3.8 mmol/L — AB (ref 0.0–2.0)
BICARBONATE: 18.5 meq/L — AB (ref 20.0–24.0)
DELIVERY SYSTEMS: POSITIVE
DRAWN BY: 422461
Expiratory PAP: 5
FIO2: 0.6 %
Inspiratory PAP: 12
Mode: POSITIVE
O2 SAT: 98.8 %
Patient temperature: 98.5
TCO2: 17 mmol/L (ref 0–100)
pCO2 arterial: 25.5 mmHg — ABNORMAL LOW (ref 35.0–45.0)
pH, Arterial: 7.473 — ABNORMAL HIGH (ref 7.350–7.450)
pO2, Arterial: 142 mmHg — ABNORMAL HIGH (ref 80.0–100.0)

## 2014-10-14 LAB — GLUCOSE, CAPILLARY
GLUCOSE-CAPILLARY: 285 mg/dL — AB (ref 70–99)
Glucose-Capillary: 221 mg/dL — ABNORMAL HIGH (ref 70–99)

## 2014-10-14 LAB — BRAIN NATRIURETIC PEPTIDE: B Natriuretic Peptide: 2056.6 pg/mL — ABNORMAL HIGH (ref 0.0–100.0)

## 2014-10-14 MED ORDER — HYDRALAZINE HCL 20 MG/ML IJ SOLN
10.0000 mg | Freq: Once | INTRAMUSCULAR | Status: AC
  Administered 2014-10-14: 10 mg via INTRAVENOUS
  Filled 2014-10-14: qty 1

## 2014-10-14 MED ORDER — HALOPERIDOL LACTATE 5 MG/ML IJ SOLN
1.0000 mg | Freq: Four times a day (QID) | INTRAMUSCULAR | Status: DC | PRN
  Administered 2014-10-14: 1 mg via INTRAVENOUS
  Filled 2014-10-14: qty 1

## 2014-10-14 MED ORDER — HYDROMORPHONE HCL 1 MG/ML IJ SOLN: 0.2500 mg | INTRAMUSCULAR | Status: DC | PRN

## 2014-10-14 MED ORDER — METOPROLOL TARTRATE 1 MG/ML IV SOLN
5.0000 mg | Freq: Once | INTRAVENOUS | Status: AC
  Administered 2014-10-14: 5 mg via INTRAVENOUS
  Filled 2014-10-14: qty 5

## 2014-10-14 MED ORDER — HYDROMORPHONE BOLUS VIA INFUSION
0.5000 mg | INTRAVENOUS | Status: DC | PRN
  Administered 2014-10-15 (×6): 0.5 mg via INTRAVENOUS
  Filled 2014-10-14 (×7): qty 1

## 2014-10-14 MED ORDER — FUROSEMIDE 10 MG/ML IJ SOLN
20.0000 mg | Freq: Once | INTRAMUSCULAR | Status: AC
  Administered 2014-10-14: 20 mg via INTRAVENOUS
  Filled 2014-10-14: qty 2

## 2014-10-14 MED ORDER — GLYCOPYRROLATE 0.2 MG/ML IJ SOLN: 0.1000 mg | Freq: Once | INTRAMUSCULAR | Status: DC

## 2014-10-14 MED ORDER — MORPHINE SULFATE 2 MG/ML IJ SOLN
1.0000 mg | Freq: Once | INTRAMUSCULAR | Status: AC
  Administered 2014-10-14: 1 mg via INTRAVENOUS
  Filled 2014-10-14: qty 1

## 2014-10-14 MED ORDER — HYDROMORPHONE HCL 1 MG/ML IJ SOLN
0.2500 mg | INTRAMUSCULAR | Status: DC | PRN
  Administered 2014-10-14: 1 mg via INTRAVENOUS
  Filled 2014-10-14: qty 1

## 2014-10-14 MED ORDER — HYDROMORPHONE HCL 1 MG/ML IJ SOLN: 0.5000 mg | INTRAMUSCULAR | Status: DC | PRN

## 2014-10-14 MED ORDER — VANCOMYCIN HCL 500 MG IV SOLR
500.0000 mg | Freq: Two times a day (BID) | INTRAVENOUS | Status: DC
  Administered 2014-10-14: 500 mg via INTRAVENOUS
  Filled 2014-10-14 (×2): qty 500

## 2014-10-14 MED ORDER — GLUCERNA SHAKE PO LIQD
237.0000 mL | Freq: Three times a day (TID) | ORAL | Status: DC
  Filled 2014-10-14 (×2): qty 237

## 2014-10-14 MED ORDER — SODIUM CHLORIDE 0.9 % IV SOLN
1.0000 mg/h | INTRAVENOUS | Status: DC
  Administered 2014-10-14: 0.25 mg/h via INTRAVENOUS
  Administered 2014-10-15: 1 mg/h via INTRAVENOUS
  Filled 2014-10-14 (×2): qty 2.5

## 2014-10-14 MED ORDER — GLYCOPYRROLATE 0.2 MG/ML IJ SOLN
0.2000 mg | Freq: Two times a day (BID) | INTRAMUSCULAR | Status: DC | PRN
  Filled 2014-10-14: qty 1

## 2014-10-14 MED ORDER — MORPHINE SULFATE 2 MG/ML IJ SOLN: 1.0000 mg | INTRAMUSCULAR | Status: DC | PRN

## 2014-10-14 MED ORDER — DEXTROSE 5 % IV SOLN
1.0000 g | Freq: Two times a day (BID) | INTRAVENOUS | Status: DC
  Administered 2014-10-14: 1 g via INTRAVENOUS
  Filled 2014-10-14 (×2): qty 1

## 2014-10-14 MED ORDER — LORAZEPAM 2 MG/ML IJ SOLN
0.5000 mg | Freq: Four times a day (QID) | INTRAMUSCULAR | Status: DC | PRN
  Administered 2014-10-14 (×2): 0.5 mg via INTRAVENOUS
  Filled 2014-10-14 (×2): qty 1

## 2014-10-14 MED ORDER — FUROSEMIDE 10 MG/ML IJ SOLN
40.0000 mg | Freq: Once | INTRAMUSCULAR | Status: AC
  Administered 2014-10-14: 40 mg via INTRAVENOUS
  Filled 2014-10-14: qty 4

## 2014-10-14 NOTE — Progress Notes (Signed)
Pt given morphine to help decrease WOB. Pt sleeping, O2 sats 98-99% on BiPAP with RR from mid-30s to low 40s. Hospitalist notified and aware. Will continue to monitor.

## 2014-10-14 NOTE — Progress Notes (Signed)
INITIAL NUTRITION ASSESSMENT  DOCUMENTATION CODES Per approved criteria  -Severe malnutrition in the context of chronic illness  Pt meets criteria for severe MALNUTRITION in the context of chronic illness as evidenced by severe muscle and fat depletion.  INTERVENTION: Change Ensure to Glucerna Shake po TID, each supplement provides 220 kcal and 10 grams of protein Encourage PO intake RD to continue to monitor  NUTRITION DIAGNOSIS: Malnutrition related to advanced age as evidenced by severe muscle and fat depletion.   Goal: Pt to meet >/= 90% of their estimated nutrition needs   Monitor:  PO and supplemental intake, weight, labs, I/O's  Reason for Assessment: Pt identified as at nutrition risk on the Malnutrition Screen Tool  Admitting Dx: Sepsis with hypotension  ASSESSMENT: 81 y.o. gentleman with a history of HTN, DM, chronic anemia, and prostate cancer who was brought to the ED accompanied by his caregivers for evaluation of nausea, vomiting, and weakness for approximately 24 hours prior to presentation. Appetite has been stable (breakfast is typically his biggest meal).  Pt in room with Home health aid in room at bedside. Pt unable to provide history as he is on re-breather. Per RN, pt was on Bi-Pap earlier and was unable to have anything PO. RN requested Ensure supplement be changed to Glucerna shakes d/t elevated Glucose levels. RD to order.  Per weight history documentation, pt has lost 13 lb since 01/24/14 (9% weight loss x 8 months, insignificant for time frame).   Nutrition Focused Physical Exam:  Subcutaneous Fat:  Orbital Region: mild depletion Upper Arm Region: moderate -severe depletion Thoracic and Lumbar Region: NA  Muscle:  Temple Region: moderate depletion Clavicle Bone Region: severe depletion Clavicle and Acromion Bone Region: severe depletion Scapular Bone Region: NA Dorsal Hand: severe depletion Patellar Region: NA Anterior Thigh Region:  NA Posterior Calf Region: NA  Edema: no LE edema  Labs reviewed: Elevated K Elevated BUN Glucose 270  Height: Ht Readings from Last 1 Encounters:  10/17/2014 5' 10.87" (1.8 m)    Weight: Wt Readings from Last 1 Encounters:  10/14/14 137 lb 12.6 oz (62.5 kg)    Ideal Body Weight: 166 lb  % Ideal Body Weight: 83%  Wt Readings from Last 10 Encounters:  10/14/14 137 lb 12.6 oz (62.5 kg)  01/24/14 150 lb (68.04 kg)  01/05/14 150 lb (68.04 kg)  11/29/13 151 lb (68.493 kg)  09/25/13 158 lb (71.668 kg)  07/26/13 158 lb (71.668 kg)  06/28/13 158 lb (71.668 kg)  05/24/13 158 lb (71.668 kg)  05/12/13 167 lb (75.751 kg)  05/11/13 159 lb (72.122 kg)    Usual Body Weight: 158 lb -per weight history  % Usual Body Weight: 87%  BMI:  Body mass index is 19.29 kg/(m^2).  Estimated Nutritional Needs: Kcal: 1550-1750 Protein: 75-85g Fluid: 1.6L/day  Skin: stage I pressure ulcer  Diet Order: Diet heart healthy/carb modified Room service appropriate?: Yes; Fluid consistency:: Thin  EDUCATION NEEDS: -No education needs identified at this time   Intake/Output Summary (Last 24 hours) at 10/14/14 1207 Last data filed at 10/14/14 1100  Gross per 24 hour  Intake 1956.67 ml  Output   1060 ml  Net 896.67 ml    Last BM: 3/26  Labs:   Recent Labs Lab 10/04/2014 2227 10/13/14 0345 10/14/14 0400  NA 136 138 137  K 4.8 4.9 5.5*  CL 103 104 106  CO2 24 24 20   BUN 35* 34* 44*  CREATININE 0.96 0.80 0.88  CALCIUM 8.2* 8.7 8.9  MG 1.7  --   --   GLUCOSE 261* 213* 270*    CBG (last 3)   Recent Labs  10/13/14 1635 10/13/14 2126 10/14/14 0737  GLUCAP 322* 307* 285*    Scheduled Meds: . antiseptic oral rinse  7 mL Mouth Rinse BID  . ceFEPime (MAXIPIME) IV  1 g Intravenous Q12H  . feeding supplement (ENSURE ENLIVE)  237 mL Oral BID BM  . furosemide  20 mg Intravenous Once  . hydrocortisone sod succinate (SOLU-CORTEF) inj  100 mg Intravenous Q8H  . insulin aspart   0-9 Units Subcutaneous TID WC  . iron polysaccharides  150 mg Oral TID  . metoprolol  5 mg Intravenous Once  . mirtazapine  15 mg Oral QHS  . multivitamin with minerals  1 tablet Oral Daily  . pantoprazole (PROTONIX) IV  40 mg Intravenous Q12H  . vancomycin  500 mg Intravenous Q12H    Continuous Infusions:   Past Medical History  Diagnosis Date  . Diabetes   . Prostate cancer   . Arthritis   . Anemia   . Dysrhythmia     "irregularities" in rhythm  . Osler hemorrhagic telangiectasia syndrome     Hx: of  . Hypertension     Past Surgical History  Procedure Laterality Date  . Cataract extraction w/ intraocular lens  implant, bilateral      Hx: of  . Colonoscopy      HX: of    Clayton Bibles, MS, RD, LDN Pager: [phone] After Hours Pager: [phone]

## 2014-10-14 NOTE — Progress Notes (Signed)
[facility]                                                                              Patient Demographics  Jason Lawson, is a 81 y.o. male, DOB - 1916-05-13, BHA:[id number]  Admit date - 09/23/2014   Admitting Physician Lily Kocher, MD  Outpatient Primary MD for the patient is Sheela Stack, MD  LOS - 2   Chief Complaint  Patient presents with  . Emesis  . Weakness       Brief HPI   Jason Lawson is a 81 y.o. gentleman with a history of HTN, DM, chronic anemia, and prostate cancer who was brought to the ED accompanied by his caregivers for evaluation of nausea, vomiting, and weakness for approximately 24 hours prior to presentation. He denied chest pain or abdominal pain. The patient reported subacute, intermittent SOB in the ED, but he denied SOB currently. He has had intermittent cough, productive of clear phlegm. Appetite has been stable (breakfast is typically his biggest meal). Caregivers reported several week history of melena (which they do not think is simply related to his iron supplement). No lightheadedness. The patient had not passed out. The patient has several stairs in his home, and he typically ambulates with a walker and at least one-person assist at baseline. His activity has been limited due to weakness. No falls. At least two of his caregivers have had recent upper respiratory symptoms. Upon presentation to the ED, he was found to be febrile and tachycardic, with an elevated WBC count to 31,000. Chest xray shows bibasilar infiltrates. U/A also showed large leukocytes, TNTC WBCs, and many bacteria. BP in ED was 97'D to 53'G systolic, with MAPs holding between 60 and 65. He was admitted for management of septic shock secondary to CAP and UTI. Of note, the patient was discharged from hospice care in December. He is still able to pay his own bills. He goes out to dinner on occasion. His wife and daughter are deceased, and his next of  kin/POA is in [place]. He has a DNR/DNI status.     Assessment & Plan    Principal Problem:   Sepsis with hypotension/septic shock secondary to community-acquired pneumonia and UTI, acute respiratory failure - Overnight events noted, patient was placed on BiPAP around 11:20 PM for increased work of breathing, tachypnea. ABG 7.4/25/142. I ordered stat chest x-ray, BNP as patient has received more than 4 L of IV fluid hydration for septic shock. Chest x-ray showed worsening any radiation, worsening patchy bilateral airspace, superimposed pulmonary edema. BNP 2056, I's and O's showed 5.7 L positive balance - Gave 40 mg IV Lasix X1 in the morning, will repeat another 20 mg IV Lasix, Lopressor 5 mg IV 1. - Lactic acid 3.32 at the time of admission, improved to 2.68 - Discontinued IV Rocephin and Zithromax, placed on IV vancomycin and cefepime - Influenza negative, urine strep antigen negative,urine legionella antigen pending, follow blood cultures and urine cultures - Palliative medicine consult also placed, patient was in hospice care until December last year - Called patient's HPOA, Mrs. Truman Hayward but was unable to contact her, left her a  detailed message.   Active Problems: Elevated troponin, sinus tachycardia- Likely due to severe sepsis, demand ischemia, no chest pain or shortness of breath at the time of examination - Continue serial troponins, follow 2-D echocardiogram - Not a candidate for any invasive procedures with sepsis and hemodynamic instability, patient is allergic to aspirin - third troponin elevated at 2.68, patient is not having any chest pain, discussed in detail with on-call cardiology 3/26, Dr. Bronson Ing recommended no heparin drip, patient is asymptomatic, no aspirin due to his allergy.   Reported melena, chronic anemia - Hemoglobin is stable, fecal occult blood negative   Diabetes mellitus  - Place on sliding scale insulin   Leukocytosis: Likely due to  steroids and #1  Code Status: DO NOT RESUSCITATE   Family Communication: Discussed in detail with the patient, all imaging results, lab results explained to the patient . I was unable to reach patient's health power of attorney, left her a detailed message   Disposition Plan: Remain in stepdown   Time Spent in minutes  35 minutes  Procedures  chest x-ray   Consults   Palliative medicine  DVT Prophylaxis   SCD's  Medications  Scheduled Meds: . antiseptic oral rinse  7 mL Mouth Rinse BID  . feeding supplement (ENSURE ENLIVE)  237 mL Oral BID BM  . hydrocortisone sod succinate (SOLU-CORTEF) inj  100 mg Intravenous Q8H  . insulin aspart  0-9 Units Subcutaneous TID WC  . iron polysaccharides  150 mg Oral TID  . mirtazapine  15 mg Oral QHS  . multivitamin with minerals  1 tablet Oral Daily  . pantoprazole (PROTONIX) IV  40 mg Intravenous Q12H   Continuous Infusions:   PRN Meds:.acetaminophen **OR** acetaminophen, LORazepam, morphine injection, promethazine   Antibiotics   Anti-infectives    Start     Dose/Rate Route Frequency Ordered Stop   10/13/14 2000  cefTRIAXone (ROCEPHIN) 1 g in dextrose 5 % 50 mL IVPB  Status:  Discontinued     1 g 100 mL/hr over 30 Minutes Intravenous Every 24 hours 09/19/2014 2218 10/14/14 0948   10/13/14 2000  azithromycin (ZITHROMAX) 500 mg in dextrose 5 % 250 mL IVPB  Status:  Discontinued     500 mg 250 mL/hr over 60 Minutes Intravenous Every 24 hours 10/11/2014 2218 10/14/14 0948   10/14/2014 1915  cefTRIAXone (ROCEPHIN) 1 g in dextrose 5 % 50 mL IVPB     1 g 100 mL/hr over 30 Minutes Intravenous  Once 09/22/2014 1914 10/09/2014 2025   10/02/2014 1915  azithromycin (ZITHROMAX) 500 mg in dextrose 5 % 250 mL IVPB     500 mg 250 mL/hr over 60 Minutes Intravenous  Once 09/28/2014 1914 09/19/2014 2125        Subjective:   Jason Lawson was seen and examined today am. patient was placed on BiPAP last night, at the time of my examination patient is alert and  awake and oriented. No fevers or chills, no chest pain.  Objective:   Blood pressure 154/87, pulse 118, temperature 97.6 F (36.4 C), temperature source Oral, resp. rate 36, height 5' 10.87" (1.8 m), weight 62.5 kg (137 lb 12.6 oz), SpO2 92 %.  Wt Readings from Last 3 Encounters:  10/14/14 62.5 kg (137 lb 12.6 oz)  01/24/14 68.04 kg (150 lb)  01/05/14 68.04 kg (150 lb)     Intake/Output Summary (Last 24 hours) at 10/14/14 0949 Last data filed at 10/14/14 0648  Gross per 24 hour  Intake 2156.67 ml  Output    230 ml  Net 1926.67 ml    Exam  General: Alert and oriented x 3, NAD, on BiPAP  CVS: S1 S2 auscultated, tachycardia  Respiratory: Decreased breath sounds at the bases  Abdomen: Soft, nontender, nondistended, + bowel sounds  Ext: no cyanosis clubbing or edema  Neuro: AAOx3, Cr N's II- XII. Strength 5/5 upper and lower extremities bilaterally  Skin: No rashes  Psych: Normal affect and demeanor, alert and oriented x3    Data Review   Micro Results Recent Results (from the past 240 hour(s))  Culture, blood (routine x 2)     Status: None (Preliminary result)   Collection Time: 10/07/2014  5:29 PM  Result Value Ref Range Status   Specimen Description BLOOD BLOOD RIGHT FOREARM  Final   Special Requests BOTTLES DRAWN AEROBIC AND ANAEROBIC 5ML  Final   Culture   Final           BLOOD CULTURE RECEIVED NO GROWTH TO DATE CULTURE WILL BE HELD FOR 5 DAYS BEFORE ISSUING A FINAL NEGATIVE REPORT Performed at [facility]    Report Status PENDING  Incomplete  Culture, blood (routine x 2)     Status: None (Preliminary result)   Collection Time: 09/19/2014  5:31 PM  Result Value Ref Range Status   Specimen Description BLOOD LEFT ANTECUBITAL  Final   Special Requests BOTTLES DRAWN AEROBIC AND ANAEROBIC 5ML  Final   Culture   Final           BLOOD CULTURE RECEIVED NO GROWTH TO DATE CULTURE WILL BE HELD FOR 5 DAYS BEFORE ISSUING A FINAL NEGATIVE REPORT Performed at  [facility]    Report Status PENDING  Incomplete  MRSA PCR Screening     Status: None   Collection Time: 09/28/2014  9:52 PM  Result Value Ref Range Status   MRSA by PCR NEGATIVE NEGATIVE Final    Comment:        The GeneXpert MRSA Assay (FDA approved for NASAL specimens only), is one component of a comprehensive MRSA colonization surveillance program. It is not intended to diagnose MRSA infection nor to guide or monitor treatment for MRSA infections.     Radiology Reports Dg Chest 2 View  10/18/2014   CLINICAL DATA:  Nausea and vomiting.  History of prostate carcinoma  EXAM: CHEST  2 VIEW  COMPARISON:  January 24, 2014  FINDINGS: There is patchy infiltrate in both lower lobes with greater consolidation in the left base compared to the right base. There is a small effusion on the left. There is mild cardiomegaly with pulmonary vascularity within normal limits. There is atherosclerotic change in the aortic arch region. No adenopathy. No bone lesions.  IMPRESSION: Airspace consolidation in both lower lobes, more severe on the left than on the right. Small left effusion. Mild cardiac prominence.   Electronically Signed   By: Lowella Grip III M.D.   On: 09/20/2014 18:36   Dg Chest Port 1 View  10/14/2014   CLINICAL DATA:  Tachypnea  EXAM: PORTABLE CHEST - 1 VIEW  COMPARISON:  10/08/2014  FINDINGS: There is worsening in aeration. Worsening bilateral patchy airspace opacification highly suspicious for worsening infiltrate/pneumonia. Superimposed pulmonary edema cannot be excluded. Persistent small left pleural effusion left basilar atelectasis or infiltrate.  IMPRESSION: Worsening in aeration. Worsening patchy bilateral airspace opacification highly suspicious for worsening infiltrates. Superimposed pulmonary edema cannot be excluded. Persistent small left pleural effusion with left basilar atelectasis or infiltrate.   Electronically  Signed   By: Lahoma Crocker M.D.   On: 10/14/2014 09:17     CBC  Recent Labs Lab 09/23/2014 1729 10/06/2014 2227 10/13/14 0345 10/14/14 0400  WBC 31.3* 29.4* 29.1* 28.9*  HGB 11.2* 10.1* 9.9* 10.4*  HCT 36.4* 33.7* 32.5* 33.2*  PLT 519* 435* 437* 421*  MCV 84.8 85.5 85.3 83.2  MCH 26.1 25.6* 26.0 26.1  MCHC 30.8 30.0 30.5 31.3  RDW 16.3* 16.3* 16.6* 16.5*  LYMPHSABS 1.3  --   --   --   MONOABS 0.9  --   --   --   EOSABS 0.0  --   --   --   BASOSABS 0.0  --   --   --     Chemistries   Recent Labs Lab 10/03/2014 1729  2227 10/13/14 0345 10/14/14 0400  NA 135 136 138 137  K 4.9 4.8 4.9 5.5*  CL 97 103 104 106  CO2 23 24 24 20   GLUCOSE 251* 261* 213* 270*  BUN 37* 35* 34* 44*  CREATININE 1.02 0.96 0.80 0.88  CALCIUM 9.3 8.2* 8.7 8.9  MG  --  1.7  --   --   AST 27 128*  --   --   ALT 10 50  --   --   ALKPHOS 67 68  --   --   BILITOT 0.7 0.7  --   --    ------------------------------------------------------------------------------------------------------------------ estimated creatinine clearance is 43.4 mL/min (by C-G formula based on Cr of 0.88). ------------------------------------------------------------------------------------------------------------------ No results for input(s): HGBA1C in the last 72 hours. ------------------------------------------------------------------------------------------------------------------ No results for input(s): CHOL, HDL, LDLCALC, TRIG, CHOLHDL, LDLDIRECT in the last 72 hours. ------------------------------------------------------------------------------------------------------------------ No results for input(s): TSH, T4TOTAL, T3FREE, THYROIDAB in the last 72 hours.  Invalid input(s): FREET3 ------------------------------------------------------------------------------------------------------------------ No results for input(s): VITAMINB12, FOLATE, FERRITIN, TIBC, IRON, RETICCTPCT in the last 72 hours.  Coagulation profile  Recent Labs Lab 10/05/2014 2227  INR 1.26     No results for input(s): DDIMER in the last 72 hours.  Cardiac Enzymes  Recent Labs Lab 10/18/2014 2227 10/13/14 0345 10/13/14 1031  TROPONINI 0.52* 1.62* 2.68*   ------------------------------------------------------------------------------------------------------------------ Invalid input(s): POCBNP   Recent Labs  09/29/2014 2210 10/13/14 0753 10/13/14 1153 10/13/14 1635 10/13/14 2126 10/14/14 0737  GLUCAP 268* 195* 292* 322* 307* 57*     Mikia Delaluz M.D. [facility] 10/14/2014, 9:49 AM  Pager: [phone]   Between 7am to 7pm - call Pager - [phone]  After 7pm go to [email or web address] - password [email or web address]  Call night coverage person covering after 7pm

## 2014-10-14 NOTE — Progress Notes (Signed)
[place] for Vancomycin, Cefepime Indication: pneumonia  Allergies  Allergen Reactions  . Aspirin Other (See Comments)    Causes GI bleeding    Patient Measurements: Height: 5' 10.87" (180 cm) Weight: 137 lb 12.6 oz (62.5 kg) IBW/kg (Calculated) : 74.99  Vital Signs: Temp: 97.6 F (36.4 C) (03/27 0800) Temp Source: Oral (03/27 0800) BP: 154/87 mmHg (03/27 0800) Pulse Rate: 118 (03/27 0800) Intake/Output from previous day: 03/26 0701 - 03/27 0700 In: 2800 [P.O.:120; I.V.:2380; IV Piggyback:300] Out: 310 [Urine:310]  Labs:  Recent Labs  10/11/2014 2227 10/13/14 0345 10/14/14 0400  WBC 29.4* 29.1* 28.9*  HGB 10.1* 9.9* 10.4*  PLT 435* 437* 421*  CREATININE 0.96 0.80 0.88   Estimated Creatinine Clearance: 43.4 mL/min (by C-G formula based on Cr of 0.88). No results for input(s): VANCOTROUGH, VANCOPEAK, VANCORANDOM, GENTTROUGH, GENTPEAK, GENTRANDOM, TOBRATROUGH, TOBRAPEAK, TOBRARND, AMIKACINPEAK, AMIKACINTROU, AMIKACIN in the last 72 hours.    Assessment: 89 yoM admitted 3/25 from home with vomiting, weakness, CAP, and UTI.  Chest xray shows bibasilar infiltrates. Pharmacy was initially consulted to dose ceftriaxone/azithromycin, but now have been escalated to Vancomycin and Cefepime for worsening pneumonia.  3/25 >> azithromycin >> 3/27 3/25 >> ceftriaxone >>  3/27 3/27 >> Vanc >> 3/27 >> Cefepime >>  Today, 10/14/2014:  Day #3 ceftriaxone/azithromycin, changed now to Vanc/cefepime  Tmax: 98.5  WBCs: 28.9  Renal: SCr 0.88, CrCl 43 ml/min    Goal of Therapy:  Vancomycin trough level 15-20 mcg/ml Appropriate abx dosing, eradication of infection.  Plan:   Cefepime 1g IV q12h  Vancomycin 500mg  IV q12h.  Measure Vanc trough at steady state.  Follow up renal fxn, culture results, and clinical course.   Gretta Arab PharmD, BCPS Pager [phone] 10/14/2014 9:58 AM

## 2014-10-14 NOTE — Progress Notes (Signed)
Hospitalist aware

## 2014-10-14 NOTE — Progress Notes (Signed)
  Echocardiogram 2D Echocardiogram has been performed.  Jason Lawson 10/14/2014, 9:02 AM

## 2014-10-14 NOTE — Progress Notes (Signed)
Jason Lawson requested chaplain to come and pray for him. When chaplain arrived Jason Lawson was asleep/medicated. Prayer was prayed in his ear while hired sitter was also at bedside.  Page chaplain as need or desire arises  Sallee Lange. Kelton Bultman, [place]

## 2014-10-14 NOTE — Progress Notes (Signed)
Patient has become comfort care per family's wishes. Palliative consulted, Dr. Hilma Favors came and saw patient. Patient was given total of 1 mg IV push dilauded initially and 1mg  haldol iv push. Then Dilauded gtt started initially at .5mg  and hour then changed to 1 mg an hour. Patient currently appears comfortable, is sleeping. Resp 34 on non rebreather, and O2 93%. Family notified.

## 2014-10-14 NOTE — Progress Notes (Signed)
Administered hydralazine as ordered.

## 2014-10-14 NOTE — Consult Note (Addendum)
Mr. Jason Lawson is a 81 yo man with severe bibasilar PNA who has declined rapidly following the death of his daughter Jason Lawson about 6 months ago-I cared for Jason Lawson his daughter and worked closely with Jason Lawson who is Mr. Jason Lawson. She reports that all of Mr. Jason Lawson affairs are in order and that he is "ready". She requests comfort and peace for him and that we not unnecessarily prolong his life in his current very poor state of health.   Mr. Jason Lawson upon my evaluation was struggling to breathe and disoriented, having agitation and delirium. Paid caregiver at bedside, but not his usual caregiver due to the holiday.   I spoke at length with Jason Lawson- goal is for comfort. Family are in route from [place] by car but I am not sure Mr. Jason Lawson will survive long enough. Reassured family that he would be well cared for and not alone.  Anticipate hospital death.  I spent >1 hour at bedside stabilizing him to achieve comfort. Started Dilaudid infusion, gave multiple loading boluses. One dose of haldol for terminal agitation. [place], [place] [phone]

## 2014-10-15 DIAGNOSIS — E43 Unspecified severe protein-calorie malnutrition: Secondary | ICD-10-CM | POA: Insufficient documentation

## 2014-10-15 LAB — LEGIONELLA ANTIGEN, URINE

## 2014-10-15 LAB — HEMOGLOBIN A1C
Hgb A1c MFr Bld: 7.2 % — ABNORMAL HIGH (ref 4.8–5.6)
MEAN PLASMA GLUCOSE: 160 mg/dL

## 2014-10-15 LAB — GLUCOSE, CAPILLARY: Glucose-Capillary: 145 mg/dL — ABNORMAL HIGH (ref 70–99)

## 2014-10-15 MED ORDER — HALOPERIDOL LACTATE 5 MG/ML IJ SOLN
1.0000 mg | Freq: Four times a day (QID) | INTRAMUSCULAR | Status: DC
  Filled 2014-10-15 (×4): qty 0.2

## 2014-10-15 MED ORDER — HYDROMORPHONE BOLUS VIA INFUSION
1.0000 mg | INTRAVENOUS | Status: DC | PRN
  Administered 2014-10-15 (×2): 2 mg via INTRAVENOUS
  Filled 2014-10-15 (×3): qty 2

## 2014-10-15 MED ORDER — LORAZEPAM 2 MG/ML IJ SOLN: 1.0000 mg | INTRAMUSCULAR | Status: DC | PRN

## 2014-10-15 NOTE — Progress Notes (Signed)
Report given to nurse. Patient was stable on transfer.

## 2014-10-15 NOTE — Progress Notes (Signed)
   10/05/2014 1443  Intake Tab  Referral Department Hospitalist  Unit at Time of Referral ICU  Palliative Care Primary Diagnosis Sepsis/Infectious Disease  Date Notified 10/13/14  Palliative Care Type New Palliative care  Reason for referral Pain;Clarify Goals of Care;End of Life Care Assistance  Date of Admission 09/26/2014  Date first seen by Palliative Care 10/14/14  # of days Palliative referral response time 1 Day(s)  # of days IP prior to Palliative referral 1  Clinical Assessment  Palliative Performance Scale Score 10%  Pain Max last 24 hours Not able to report  Pain Min Last 24 hours Not able to report  Dyspnea Max Last 24 Hours 10  Dyspnea Min Last 24 hours 10  Nausea Max Last 24 Hours Not able to report  Nausea Min Last 24 Hours Not able to report  Anxiety Max Last 24 Hours Not able to report  Anxiety Min Last 24 Hours Not able to report  Other Max Last 24 Hours Not able to report  Psychosocial & Spiritual Assessment  Social Work Plan of Care Counseling;Clarified patient/family wishes with healthcare team  Palliative Care Outcomes  Patient/Family meeting held? No  Who was at the meeting? HCPOA  Palliative Care Outcomes Improved pain interventions  Patient/Family wishes: Interventions discontinued/not started  Antibiotics;Transfer out of ICU

## 2014-10-15 NOTE — Progress Notes (Signed)
Patient ID: Jason Lawson, male   DOB: 1916-06-06, 81 y.o.   MRN: [id number]  [facility] PROGRESS NOTE  Jason Lawson NWG:[id number] DOB: 1915/10/31 DOA: 10/09/2014 PCP: Sheela Stack, MD   Brief narrative:    81 y.o. gentleman with a history of HTN, DM, chronic anemia, and prostate cancer who was brought to the ED accompanied by his caregivers for evaluation of nausea, vomiting, and weakness for approximately 24 hours prior to presentation. He denied chest pain or abdominal pain. The patient reported subacute, intermittent SOB in the ED. He has had intermittent cough, productive of clear phlegm. Appetite has been stable (breakfast is typically his biggest meal). Caregivers reported several week history of melena (which they do not think is simply related to his iron supplement). No lightheadedness. The patient had not passed out. The patient has several stairs in his home, and he typically ambulates with a walker and at least one-person assist at baseline. His activity has been limited due to weakness. No falls. At least two of his caregivers have had recent upper respiratory symptoms. Upon presentation to the ED, he was found to be febrile and tachycardic, with an elevated WBC count to 31,000. Chest xray showed bibasilar infiltrates. U/A also showed large leukocytes, WBCs, and many bacteria. BP in ED was 57'Q to 46'N systolic, with MAPs holding between 60 and 65. He was admitted for management of septic shock secondary to CAP and UTI.  Of note, the patient was discharged from hospice care in December. He is still able to pay his own bills. He goes out to dinner on occasion. His wife and daughter are deceased, and his next of kin/POA is in [place]. He has a DNR/DNI status.  Assessment/Plan:     Principal Problem: Sepsis with hypotension/septic shock secondary to community-acquired pneumonia and UTI, acute respiratory failure - Has required BiPAP March 27 due to increased work  of breathing and tachypnea - Chest x-ray March 27 notable for worsening aeration and worsening bilateral opacities, superimposed pulmonary edema - Hypotension precludes use of Lasix - Patient is currently on Dilaudid drip per family and patient's request full comfort desired - Blood work has been discontinued in order to ensure full comfort - Awaiting for family members to come from [place] - Transfer patient to palliative care unit Active Problems: Elevated troponin, sinus tachycardia - Determined to be secondary to severe sepsis, demand ischemia  - No further blood work or interventions in order to respect patient's and family wishes  - Continue with Dilaudid drip ensuring conference   Reported melena, chronic anemia, iron deficiency anemia - Again, no further blood work to ensure comfort  Hyperkalemia - No further blood work as noted above  Diabetes mellitus  - Was on sliding scale, avoiding additional blood work as noted above  Severe protein calorie malnutrition - In the context of acute illness  DVT Prophylaxis: SCD's  Code Status: DO NOT RESUSCITATE Family Communication:  Plan of care discussed with caregiver at bedside, awaiting for family members to arrive from [place] Disposition Plan: Transferred to palliative care unit  IV access:  Peripheral IV  Procedures and diagnostic studies:    Dg Chest 2 View  10/11/2014   Airspace consolidation in both lower lobes, more severe on the left than on the right. Small left effusion. Mild cardiac prominence.   Dg Chest Port 1 View  10/14/2014  Worsening in aeration. Worsening patchy bilateral airspace opacification highly suspicious for worsening infiltrates. Superimposed pulmonary edema cannot be excluded. Persistent small left  pleural effusion with left basilar atelectasis or infiltrate.    Medical Consultants:  PCT - Dr. Hilma Favors   Other Consultants:  None  IAnti-Infectives:   None   Faye Ramsay, MD  [facility] Pager  [phone]  If 7PM-7AM, please contact night-coverage [email or web address] Password [email or web address] 09/22/2014, 11:14 AM   LOS: 3 days   HPI/Subjective: No events overnight.   Objective: Filed Vitals:   10/14/14 1600 10/14/14 2000 10/09/2014 0000 09/28/2014 0330  BP: 107/95   83/57  Pulse: 106 98 93   Temp: 98.5 F (36.9 C)     TempSrc: Oral     Resp: 42 33 28   Height:      Weight:      SpO2: 93% 96% 100%     Intake/Output Summary (Last 24 hours) at 10/17/2014 1114 Last data filed at 09/19/2014 0600  Gross per 24 hour  Intake   66.5 ml  Output    655 ml  Net -588.5 ml    Exam:   General:  Pt is somnolent, a nonrebreather mask, not in acute distress  Cardiovascular: Regular rate and rhythm, no rubs, no gallops  Respiratory: Coarse breath sounds with bilateral rhonchi's, diminished breath sounds at bases  Abdomen: Soft, non tender, non distended  Extremities: pulses DP and PT palpable bilaterally   Data Reviewed: Basic Metabolic Panel:  Recent Labs Lab 09/29/2014 1729 09/23/2014 2227 10/13/14 0345 10/14/14 0400  NA 135 136 138 137  K 4.9 4.8 4.9 5.5*  CL 97 103 104 106  CO2 23 24 24 20   GLUCOSE 251* 261* 213* 270*  BUN 37* 35* 34* 44*  CREATININE 1.02 0.96 0.80 0.88  CALCIUM 9.3 8.2* 8.7 8.9  MG  --  1.7  --   --    Liver Function Tests:  Recent Labs Lab 09/19/2014 1729 10/14/2014 2227  AST 27 128*  ALT 10 50  ALKPHOS 67 68  BILITOT 0.7 0.7  PROT 8.2 6.8  ALBUMIN 3.6 2.9*   CBC:  Recent Labs Lab 09/30/2014 1729 10/05/2014 2227 10/13/14 0345 10/14/14 0400  WBC 31.3* 29.4* 29.1* 28.9*  NEUTROABS 29.1*  --   --   --   HGB 11.2* 10.1* 9.9* 10.4*  HCT 36.4* 33.7* 32.5* 33.2*  MCV 84.8 85.5 85.3 83.2  PLT 519* 435* 437* 421*   Cardiac Enzymes:  Recent Labs Lab 09/22/2014 2227 10/13/14 0345 10/13/14 1031  TROPONINI 0.52* 1.62* 2.68*   BNP: Invalid input(s): POCBNP CBG:  Recent Labs Lab 10/13/14 1635 10/13/14 2126 10/14/14 0737 10/14/14 1141  0732   GLUCAP 322* 307* 285* 221* 145*    Recent Results (from the past 240 hour(s))  Culture, blood (routine x 2)     Status: None (Preliminary result)   Collection Time:   5:29 PM  Result Value Ref Range Status   Specimen Description BLOOD BLOOD RIGHT FOREARM  Final   Special Requests BOTTLES DRAWN AEROBIC AND ANAEROBIC 5ML  Final   Culture   Final           BLOOD CULTURE RECEIVED NO GROWTH TO DATE CULTURE WILL BE HELD FOR 5 DAYS BEFORE ISSUING A FINAL NEGATIVE REPORT Performed at [facility]    Report Status PENDING  Incomplete  Culture, blood (routine x 2)     Status: None (Preliminary result)   Collection Time: 10/11/2014  5:31 PM  Result Value Ref Range Status   Specimen Description BLOOD LEFT ANTECUBITAL  Final   Special Requests BOTTLES DRAWN AEROBIC AND ANAEROBIC 5ML  Final   Culture   Final           BLOOD CULTURE RECEIVED NO GROWTH TO DATE CULTURE WILL BE HELD FOR 5 DAYS BEFORE ISSUING A FINAL NEGATIVE REPORT Performed at [facility]    Report Status PENDING  Incomplete  Urine culture     Status: None   Collection Time: 10/07/2014  8:34 PM  Result Value Ref Range Status   Specimen Description URINE, RANDOM  Final   Special Requests NONE  Final   Colony Count   Final    >=100,000 COLONIES/ML Performed at [facility]    Culture   Final    VIRIDANS STREPTOCOCCUS Performed at [facility]    Report Status 10/14/2014 FINAL  Final  MRSA PCR Screening     Status: None   Collection Time: 10/13/2014  9:52 PM  Result Value Ref Range Status   MRSA by PCR NEGATIVE NEGATIVE Final    Comment:        The GeneXpert MRSA Assay (FDA approved for NASAL specimens only), is one component of a comprehensive MRSA colonization surveillance program. It is not intended to diagnose MRSA infection nor to guide or monitor treatment for MRSA infections.      Scheduled Meds: . antiseptic oral rinse  7 mL Mouth Rinse BID   Continuous Infusions: .  HYDROmorphone 1 mg/hr (10/14/14 1858)

## 2014-10-19 LAB — CULTURE, BLOOD (ROUTINE X 2)
Culture: NO GROWTH
Culture: NO GROWTH

## 2014-10-19 NOTE — Progress Notes (Cosign Needed)
Found patient without pulse nor respirations. NCB status verified. Verified with Scientist, research (physical sciences).

## 2014-10-19 NOTE — Progress Notes (Signed)
Paged NP Baltazar Najjar about patient expiring. Also called patient's relative Truman Hayward. She is currently in [place] on her way up here. Sitter Herbert called agency to inform them as well.

## 2014-10-19 NOTE — Progress Notes (Signed)
Wasted 75ml of 0.5 mg/ml of dilaudid gtt with Scientist, research (physical sciences).

## 2014-10-19 DEATH — deceased

## 2014-10-21 NOTE — Discharge Summary (Signed)
Death Summary  Donaciano Range ATF:[id number] DOB: 1916/02/27 DOA: 10-17-14  PCP: Sheela Stack, MD PCP/Office notified:   Admit date: 10-17-14 Date of Death: 20-Oct-2014 at 10:16 pm  Final Diagnoses:  Principal Problem:   Sepsis with hypotension Active Problems:   Sinus tachycardia   CAP (community acquired pneumonia)   Melena   Anemia due to chronic blood loss   Protein-calorie malnutrition, severe   Brief narrative:    81 y.o. gentleman with a history of HTN, DM, chronic anemia, and prostate cancer who was brought to the ED accompanied by his caregivers for evaluation of nausea, vomiting, and weakness for approximately 24 hours prior to presentation. He denied chest pain or abdominal pain. The patient reported subacute, intermittent SOB in the ED. He has had intermittent cough, productive of clear phlegm. Appetite has been stable (breakfast is typically his biggest meal). Caregivers reported several week history of melena (which they do not think is simply related to his iron supplement). No lightheadedness. The patient had not passed out. The patient has several stairs in his home, and he typically ambulates with a walker and at least one-person assist at baseline. His activity has been limited due to weakness. No falls. At least two of his caregivers have had recent upper respiratory symptoms. Upon presentation to the ED, he was found to be febrile and tachycardic, with an elevated WBC count to 31,000. Chest xray showed bibasilar infiltrates. U/A also showed large leukocytes, WBCs, and many bacteria. BP in ED was 27'C to 62'B systolic, with MAPs holding between 60 and 65.He was admitted for management of septic shock secondary to CAP and UTI.  Assessment/Plan:     Principal Problem: Sepsis with hypotension/septic shock secondary to community-acquired pneumonia and UTI, acute respiratory failure - Has required BiPAP March 27 due to increased work of breathing and  tachypnea - Chest x-ray March 27 notable for worsening aeration and worsening bilateral opacities, superimposed pulmonary edema - pt has failed to respond to medical management - Hypotension precluded use of Lasix - pt and family desired comfort and all ABX and interventions held to respect wishes, including no blood work   Active Problems: Elevated troponin, sinus tachycardia - Determined to be secondary to severe sepsis, demand ischemia  - No further blood work or interventions in order to respect patient's and family wishes  - Continued with Dilaudid drip ensuring conference   Reported melena, chronic anemia, iron deficiency anemia - Again, no further blood work to ensure comfort  Hyperkalemia - No further blood work as noted above  Diabetes mellitus  - Was on sliding scale  Severe protein calorie malnutrition - In the context of acute illness   Code Status: DO NOT RESUSCITATE Family Communication: Plan of care discussed with caregiver at bedside, family members over the phone   IV access:  Peripheral IV  Procedures and diagnostic studies:   Dg Chest 2 View 10/17/2014 Airspace consolidation in both lower lobes, more severe on the left than on the right. Small left effusion. Mild cardiac prominence.  Dg Chest Port 1 View 10/14/2014 Worsening in aeration. Worsening patchy bilateral airspace opacification highly suspicious for worsening infiltrates. Superimposed pulmonary edema cannot be excluded. Persistent small left pleural effusion with left basilar atelectasis or infiltrate.   Medical Consultants:  PCT - Dr. Hilma Favors   Other Consultants:  None  IAnti-Infectives:   None        Signed:  Faye Ramsay  [facility] 10/21/2014, 8:52 PM  Pager [phone]

## 2015-02-08 IMAGING — CR DG CHEST 1V
1 series · 1 of 1 positions shown · non-contrast
Comparison: Chest radiograph - 06/15/2013

CLINICAL DATA: Post left-sided thoracentesis yielding 1.4 liters.

CHEST - 1 VIEW

[w chest pa]
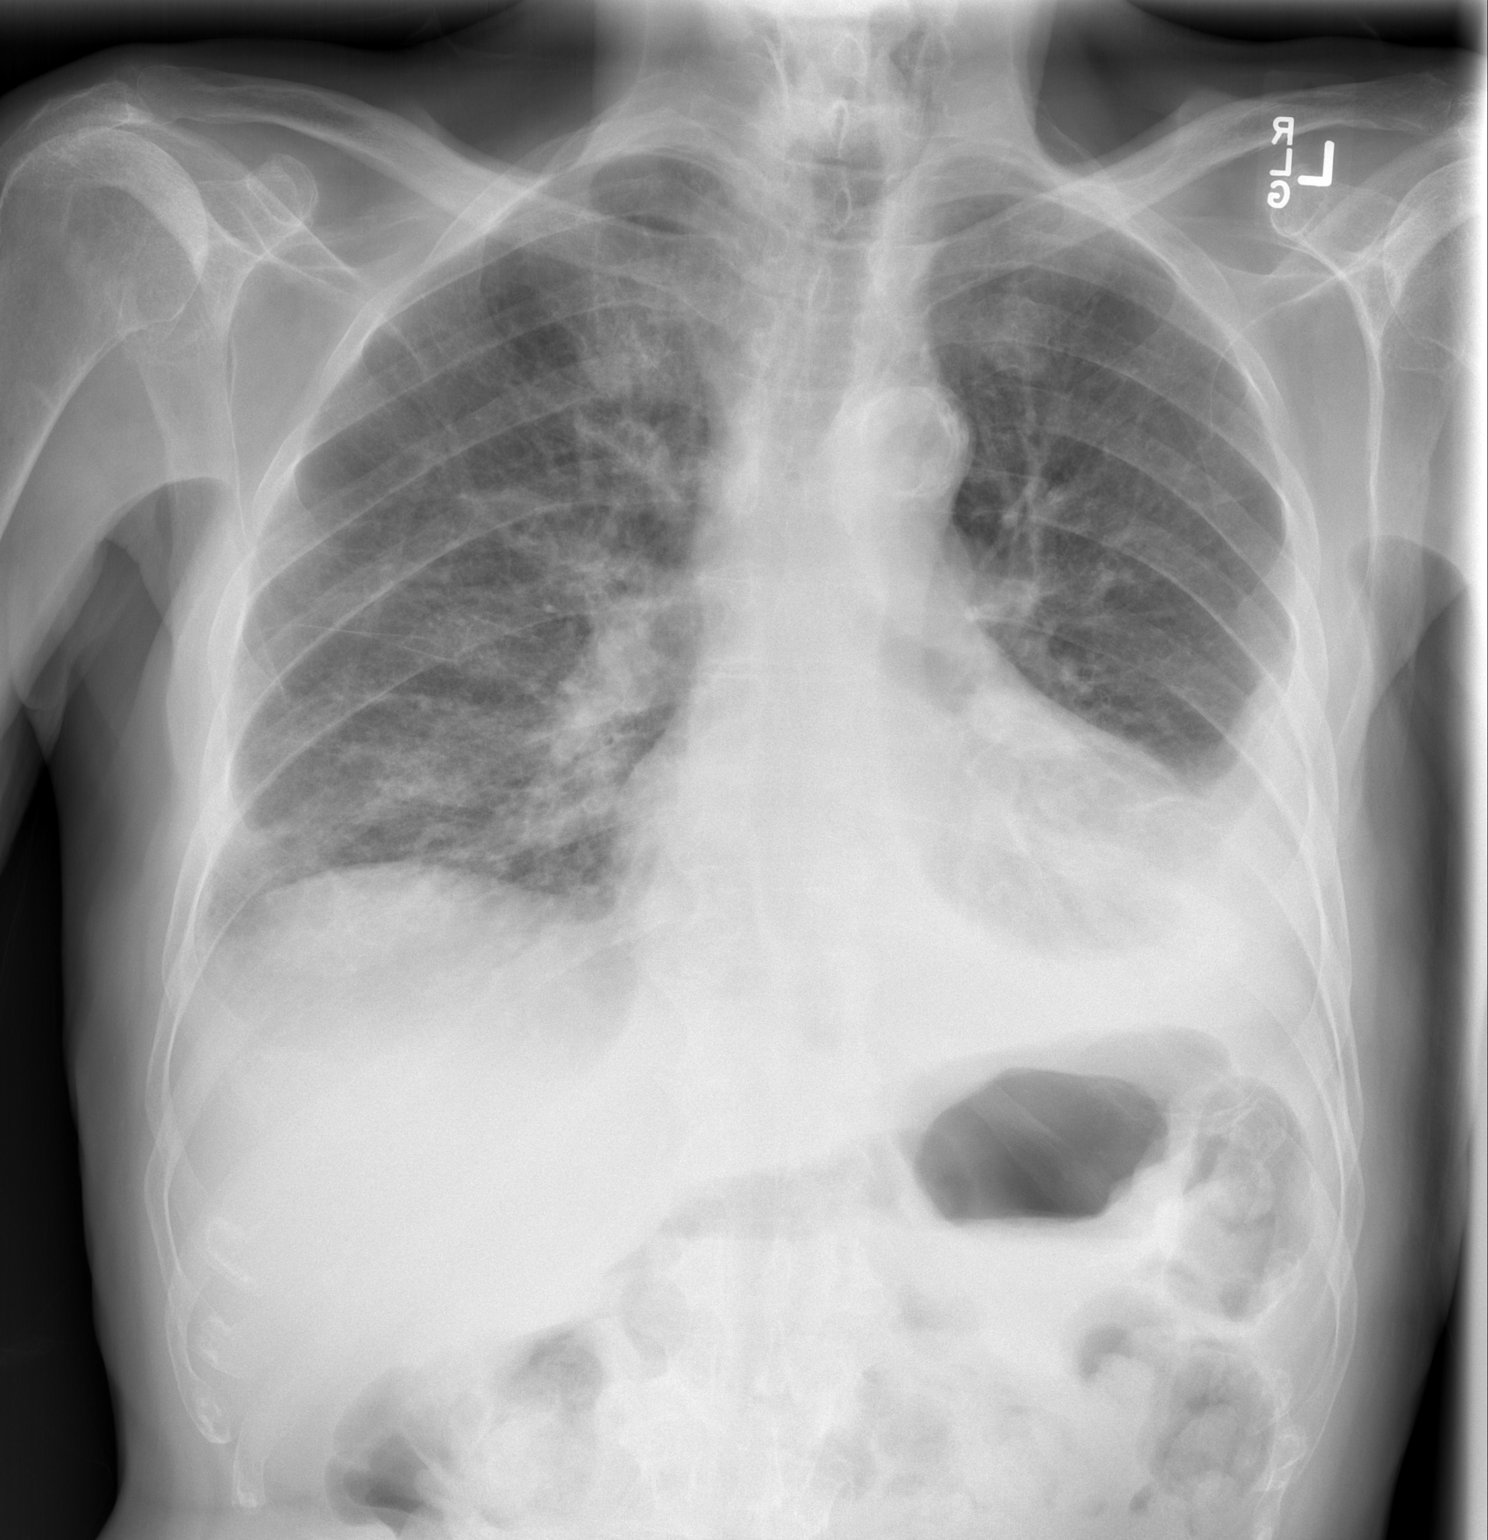

[1 of 1 positions shown; findings below may reference images not displayed]

FINDINGS: Grossly unchanged borderline enlarged cardiac silhouette, however
there is now obscuration of the left heart border secondary to a
small to moderate sized persistent possibly partially loculated
left-sided pleural effusion.  Trace right-sided pleural effusion is
suspected.  There is mild cephalization of flow.  Worsening
bilateral mid and lower lung heterogeneous opacities, left greater
than right.  No pneumothorax.  Grossly unchanged bones.
IMPRESSION: 1.  No evidence of pneumothorax post left-sided thoracentesis.
Persistent small to moderate size possibly partially loculated left-
sided effusion.

2.  Suspected trace right-sided pleural effusion.
3.  Mild cephalization of flow without frank evidence of edema.
4.  Bilateral mid and lower lung heterogeneous opacities, left and
right, atelectasis versus infiltrate.

## 2015-02-08 IMAGING — US US THORACENTESIS ASP PLEURAL SPACE W/IMG GUIDE
1 series · 8 of 8 positions shown · non-contrast
Comparison: None

CLINICAL DATA: Shortness of breath, left-sided pleural effusion.
A request diagnostic and therapeutic thoracentesis.

ULTRASOUND GUIDED left THORACENTESIS

[Series 1: us thoracentesis asp pleural space w/img guide · 0.27mm/px · 8 of 8 slices shown]
[im 1/8]
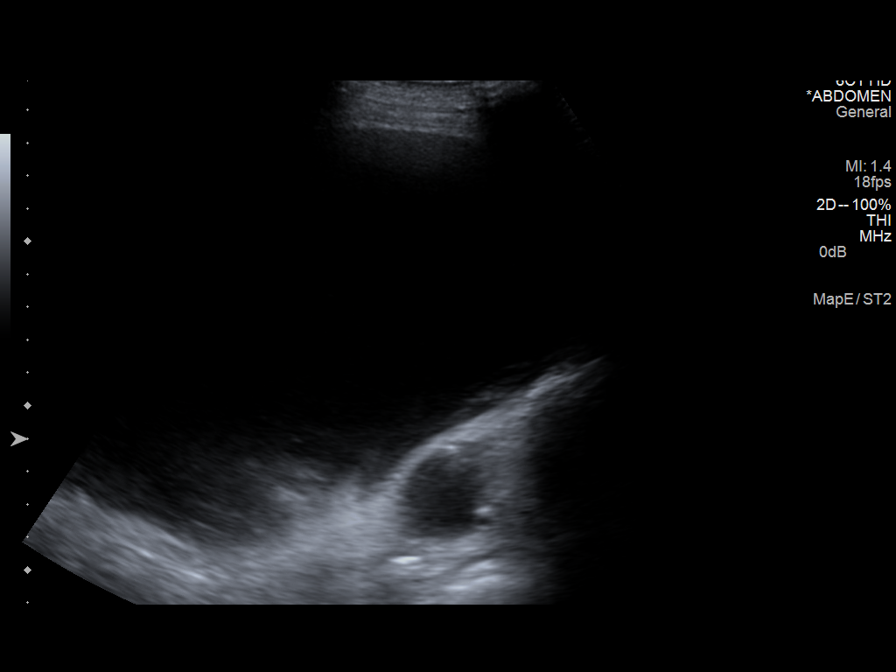
[im 2/8]
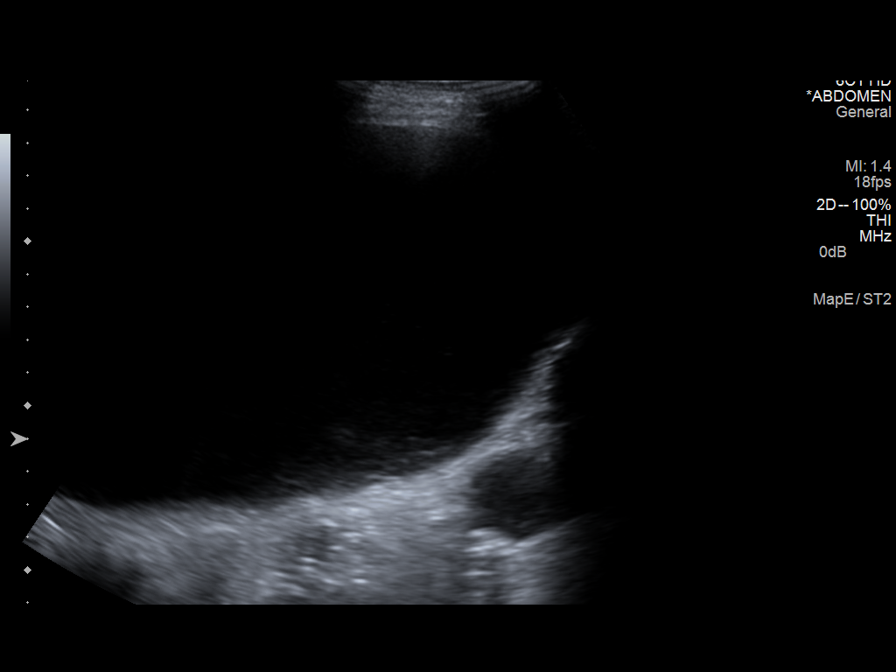
[im 3/8]
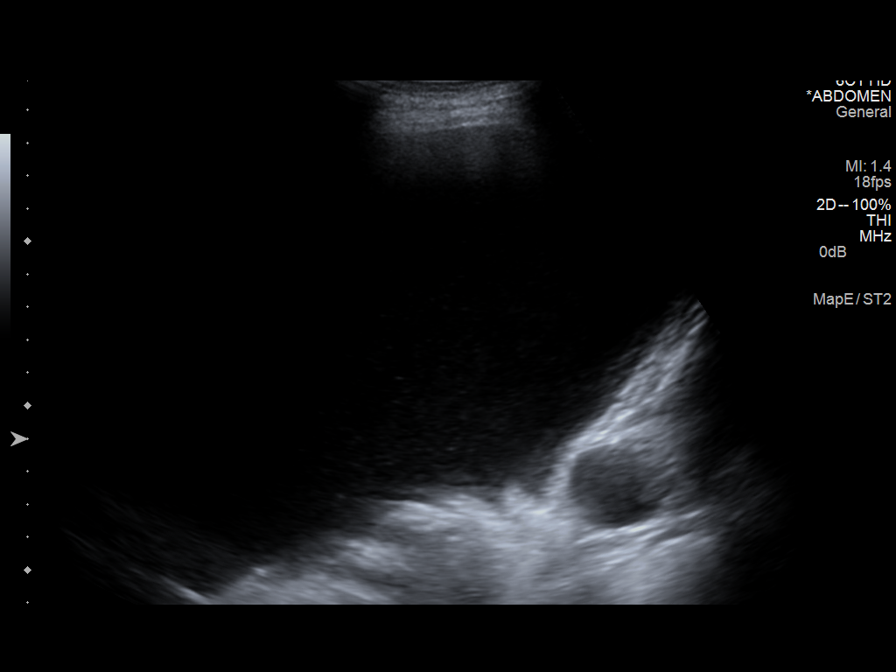
[im 4/8]
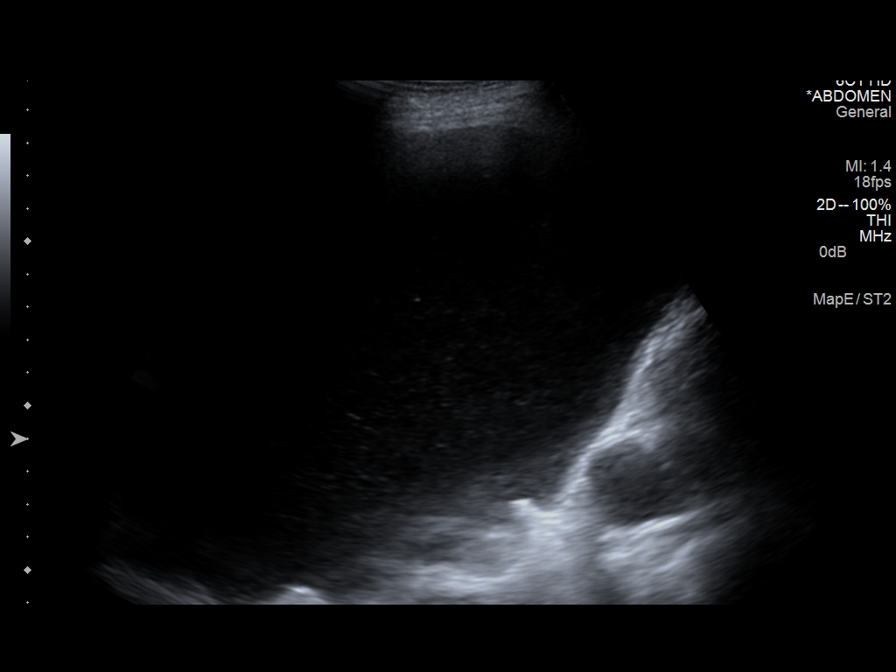
[im 5/8]
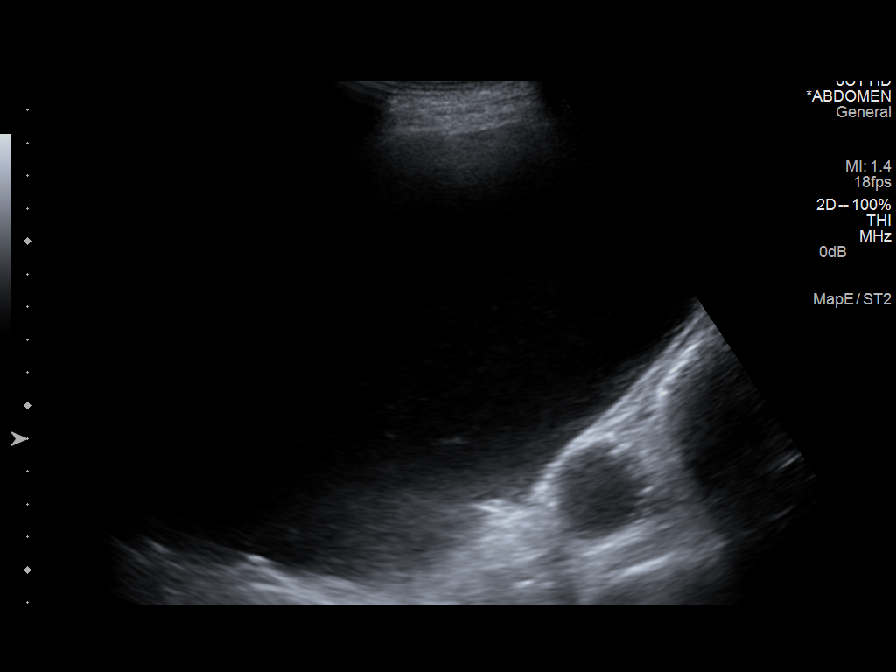
[im 6/8]
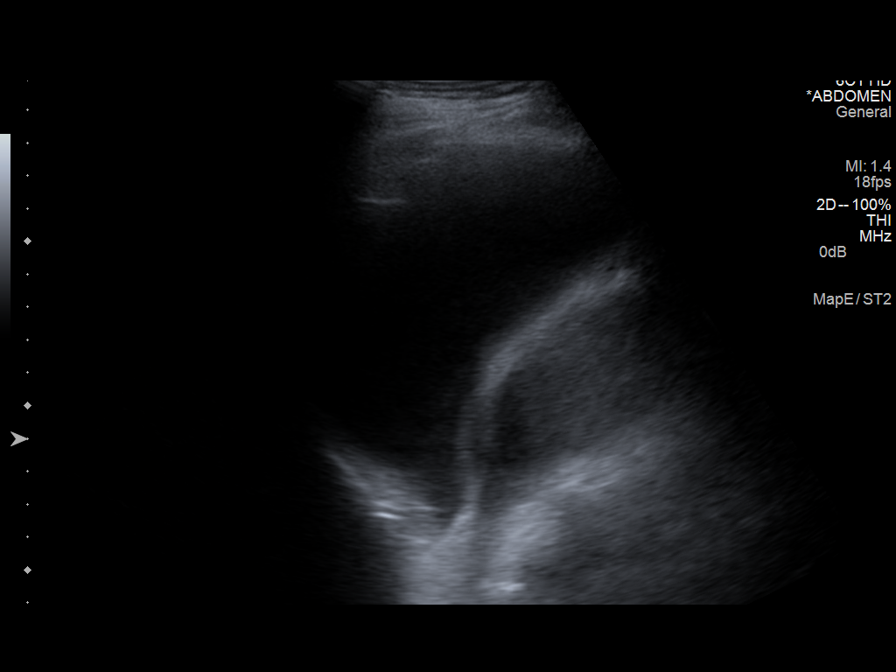
[im 7/8]
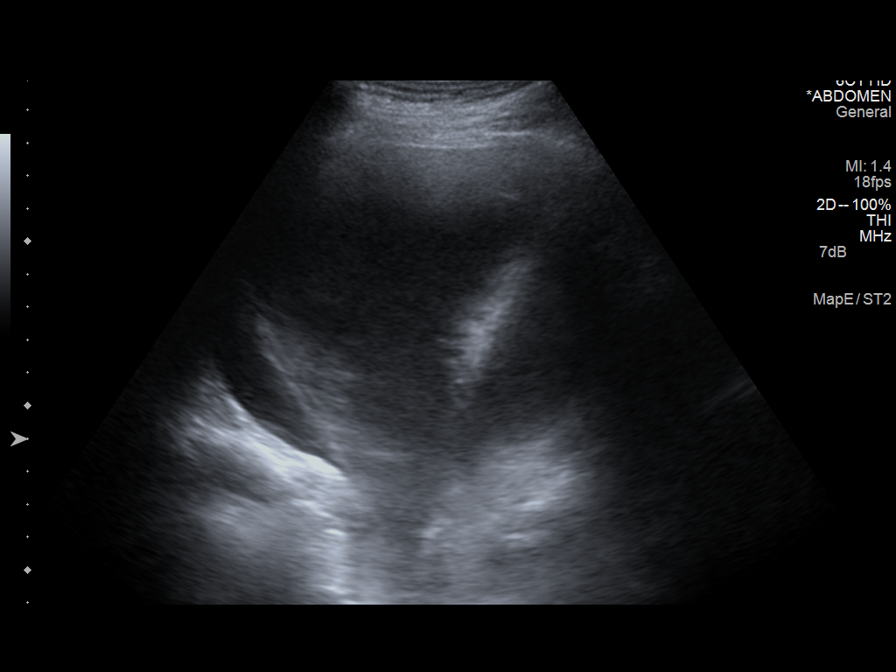
[im 8/8]
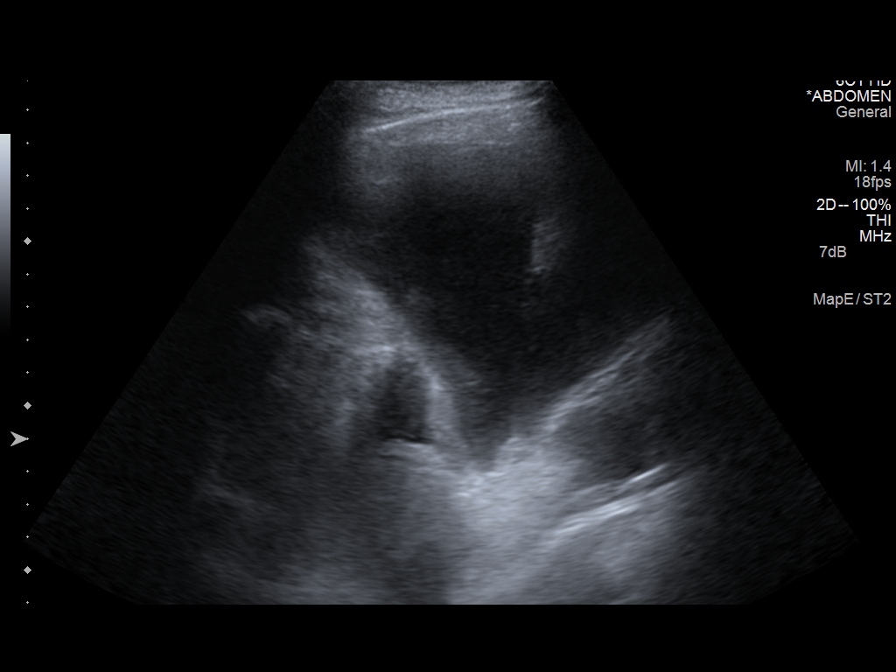

[8 of 8 positions shown; findings below may reference images not displayed]

An ultrasound guided thoracentesis was thoroughly discussed with
the patient and questions answered.  The benefits, risks,
alternatives and complications were also discussed.  The patient
understands and wishes to proceed with the procedure.  Written
consent was obtained.

Ultrasound was performed to localize and mark an adequate pocket of
fluid in the left chest.  The area was then prepped and draped in
the normal sterile fashion.  1% Lidocaine was used for local
anesthesia.  Under ultrasound guidance a 19 gauge Yueh catheter was
introduced.  Thoracentesis was performed.  The catheter was removed
and a dressing applied.

Complications:  The patient developed significant cough and the
procedure was stopped.
FINDINGS: A total of approximately 1.4 liters of clear yellow fluid
was removed. A fluid sample was sent for laboratory analysis.
IMPRESSION: Successful ultrasound guided left thoracentesis yielding 1.4 liters
of pleural fluid.

Read by: Beh, Roziha,-FRENCH
